# Patient Record
Sex: Male | Born: 1970 | Race: White | Hispanic: No | Marital: Married | Smoking: Former smoker
Health system: Southern US, Community
[De-identification: ages and names within clinical notes are randomized; demographics above are authoritative.]

## PROBLEM LIST (undated history)

## (undated) DIAGNOSIS — I5020 Unspecified systolic (congestive) heart failure: Secondary | ICD-10-CM

## (undated) DIAGNOSIS — I4892 Unspecified atrial flutter: Secondary | ICD-10-CM

## (undated) DIAGNOSIS — I10 Essential (primary) hypertension: Secondary | ICD-10-CM

## (undated) DIAGNOSIS — F1910 Other psychoactive substance abuse, uncomplicated: Secondary | ICD-10-CM

## (undated) DIAGNOSIS — R55 Syncope and collapse: Secondary | ICD-10-CM

## (undated) DIAGNOSIS — I2510 Atherosclerotic heart disease of native coronary artery without angina pectoris: Secondary | ICD-10-CM

## (undated) HISTORY — PX: NO PAST SURGERIES: SHX2092

---

## 2002-07-01 ENCOUNTER — Emergency Department (HOSPITAL_COMMUNITY): Admission: EM | Admit: 2002-07-01 | Discharge: 2002-07-01 | Payer: Self-pay | Admitting: Emergency Medicine

## 2002-09-25 ENCOUNTER — Emergency Department (HOSPITAL_COMMUNITY): Admission: EM | Admit: 2002-09-25 | Discharge: 2002-09-25 | Payer: Self-pay | Admitting: Emergency Medicine

## 2004-11-08 ENCOUNTER — Emergency Department (HOSPITAL_COMMUNITY): Admission: EM | Admit: 2004-11-08 | Discharge: 2004-11-08 | Payer: Self-pay | Admitting: Emergency Medicine

## 2005-06-11 ENCOUNTER — Emergency Department (HOSPITAL_COMMUNITY): Admission: EM | Admit: 2005-06-11 | Discharge: 2005-06-11 | Payer: Self-pay | Admitting: Emergency Medicine

## 2007-10-02 ENCOUNTER — Emergency Department: Payer: Self-pay | Admitting: Emergency Medicine

## 2009-09-26 ENCOUNTER — Emergency Department: Payer: Self-pay | Admitting: Emergency Medicine

## 2011-06-29 ENCOUNTER — Ambulatory Visit: Payer: Self-pay

## 2011-06-29 LAB — RAPID STREP-A WITH REFLX: Micro Text Report: NEGATIVE

## 2011-07-01 LAB — BETA STREP CULTURE(ARMC)

## 2013-03-08 ENCOUNTER — Ambulatory Visit: Payer: Self-pay | Admitting: Internal Medicine

## 2013-03-08 LAB — CBC WITH DIFFERENTIAL/PLATELET
Basophil #: 0.1 10*3/uL (ref 0.0–0.1)
HGB: 16.6 g/dL (ref 13.0–18.0)
Lymphocyte %: 15 %
MCH: 31.8 pg (ref 26.0–34.0)
MCV: 94 fL (ref 80–100)

## 2013-03-08 LAB — COMPREHENSIVE METABOLIC PANEL
Alkaline Phosphatase: 77 U/L (ref 50–136)
Anion Gap: 12 (ref 7–16)
BUN: 10 mg/dL (ref 7–18)
Bilirubin,Total: 0.4 mg/dL (ref 0.2–1.0)
Calcium, Total: 9.1 mg/dL (ref 8.5–10.1)
Chloride: 103 mmol/L (ref 98–107)
Creatinine: 0.94 mg/dL (ref 0.60–1.30)
Glucose: 71 mg/dL (ref 65–99)
SGOT(AST): 32 U/L (ref 15–37)
SGPT (ALT): 60 U/L (ref 12–78)

## 2013-08-26 ENCOUNTER — Emergency Department: Payer: Self-pay | Admitting: Internal Medicine

## 2013-10-23 ENCOUNTER — Ambulatory Visit: Payer: Self-pay | Admitting: Internal Medicine

## 2013-12-10 ENCOUNTER — Ambulatory Visit: Payer: Self-pay | Admitting: Gastroenterology

## 2013-12-15 ENCOUNTER — Ambulatory Visit: Payer: Self-pay | Admitting: Gastroenterology

## 2013-12-16 LAB — PATHOLOGY REPORT

## 2014-12-03 ENCOUNTER — Other Ambulatory Visit: Payer: Self-pay | Admitting: Gastroenterology

## 2014-12-03 DIAGNOSIS — R5382 Chronic fatigue, unspecified: Secondary | ICD-10-CM

## 2014-12-03 DIAGNOSIS — R1013 Epigastric pain: Secondary | ICD-10-CM

## 2014-12-09 ENCOUNTER — Ambulatory Visit: Admission: RE | Admit: 2014-12-09 | Payer: Commercial Managed Care - PPO | Source: Ambulatory Visit

## 2016-04-18 ENCOUNTER — Ambulatory Visit
Admission: EM | Admit: 2016-04-18 | Discharge: 2016-04-18 | Disposition: A | Discharge status: Home / Self Care | Payer: Commercial Managed Care - PPO | Attending: Family Medicine | Admitting: Family Medicine

## 2016-04-18 DIAGNOSIS — S39012A Strain of muscle, fascia and tendon of lower back, initial encounter: Secondary | ICD-10-CM

## 2016-04-18 HISTORY — DX: Essential (primary) hypertension: I10

## 2016-04-18 MED ORDER — HYDROCODONE-ACETAMINOPHEN 5-325 MG PO TABS: ORAL_TABLET | ORAL | 0 refills | Status: DC

## 2016-04-18 MED ORDER — PREDNISONE 20 MG PO TABS: 20.0000 mg | ORAL_TABLET | Freq: Every day | ORAL | 0 refills | Status: DC

## 2016-04-18 MED ORDER — CYCLOBENZAPRINE HCL 10 MG PO TABS: 10.0000 mg | ORAL_TABLET | Freq: Three times a day (TID) | ORAL | 0 refills | Status: DC | PRN

## 2016-04-18 NOTE — ED Provider Notes (Signed)
[facility]    CSN: [id number] Arrival date & time: 04/18/16  0910     History   Chief Complaint Chief Complaint  Patient presents with  . Back Pain    HPI Tyler [name] is a 45 y.o. male.   The history is provided by the patient.  Back Pain  Location:  Lumbar spine Quality:  Aching Radiates to:  Does not radiate Pain severity:  Moderate Pain is:  Same all the time Duration:  4 days Timing:  Constant Progression:  Unchanged Chronicity:  New Context: lifting heavy objects (helping son move) and twisting   Relieved by:  None tried Ineffective treatments:  None tried Associated symptoms: no abdominal pain, no abdominal swelling, no bladder incontinence, no bowel incontinence, no chest pain, no dysuria, no fever, no headaches, no leg pain, no numbness, no paresthesias, no pelvic pain, no perianal numbness, no tingling, no weakness and no weight loss     Past Medical History:  Diagnosis Date  . Hypertension     There are no active problems to display for this patient.   Past Surgical History:  Procedure Laterality Date  . NO PAST SURGERIES         Home Medications    Prior to Admission medications   Medication Sig Start Date End Date Taking? Authorizing Provider  amLODipine (NORVASC) 5 MG tablet Take 5 mg by mouth daily.   Yes Historical Provider, MD  cyclobenzaprine (FLEXERIL) 10 MG tablet Take 1 tablet (10 mg total) by mouth 3 (three) times daily as needed for muscle spasms. 04/18/16   Payton Mccallumrlando Debanhi Blaker, MD  HYDROcodone-acetaminophen (NORCO/VICODIN) 5-325 MG tablet 1-2 tabs po q 8 hours prn severe pain 04/18/16   Payton Mccallumrlando Abass Misener, MD  predniSONE (DELTASONE) 20 MG tablet Take 1 tablet (20 mg total) by mouth daily. 04/18/16   Payton Mccallumrlando Jonanthony Nahar, MD    Family History History reviewed. No pertinent family history.  Social History Social History  Substance Use Topics  . Smoking status: Current Every Day Smoker    Packs/day: 1.00  . Smokeless tobacco:  Never Used  . Alcohol use Yes     Comment: beer daily     Allergies   Patient has no known allergies.   Review of Systems Review of Systems  Constitutional: Negative for fever and weight loss.  Cardiovascular: Negative for chest pain.  Gastrointestinal: Negative for abdominal pain and bowel incontinence.  Genitourinary: Negative for bladder incontinence, dysuria and pelvic pain.  Musculoskeletal: Positive for back pain.  Neurological: Negative for tingling, weakness, numbness, headaches and paresthesias.     Physical Exam Triage Vital Signs ED Triage Vitals  Enc Vitals Group     BP 04/18/16 1016 (!) 157/99     Pulse Rate 04/18/16 1016 65     Resp 04/18/16 1016 17     Temp 04/18/16 1016 97.8 F (36.6 C)     Temp Source 04/18/16 1016 Oral     SpO2 04/18/16 1016 100 %     Weight 04/18/16 1014 165 lb (74.8 kg)     Height 04/18/16 1014 5\' 9"  (1.753 m)     Head Circumference --      Peak Flow --      Pain Score 04/18/16 1016 10     Pain Loc --      Pain Edu? --      Excl. in [place]? --    No data found.   Updated Vital Signs BP (!) 157/99 (BP Location: Left Arm)  Pulse 65   Temp 97.8 F (36.6 C) (Oral)   Resp 17   Ht 5\' 9"  (1.753 m)   Wt 165 lb (74.8 kg)   SpO2 100%   BMI 24.37 kg/m   Visual Acuity Right Eye Distance:   Left Eye Distance:   Bilateral Distance:    Right Eye Near:   Left Eye Near:    Bilateral Near:     Physical Exam  Constitutional: He is oriented to person, place, and time. He appears well-developed and well-nourished. No distress.  Neck: Normal range of motion. Neck supple. No tracheal deviation present.  Pulmonary/Chest: Effort normal. No stridor. No respiratory distress.  Musculoskeletal:       Cervical back: He exhibits normal range of motion, no tenderness, no bony tenderness, no swelling, no edema, no deformity, no laceration, no pain, no spasm and normal pulse.       Lumbar back: He exhibits tenderness (over the lumbar paraspinous  muscles, left greater than right) and spasm. He exhibits normal range of motion, no bony tenderness, no swelling, no edema, no deformity, no laceration, no pain and normal pulse.  Neurological: He is alert and oriented to person, place, and time. He has normal reflexes. He displays normal reflexes. He exhibits normal muscle tone. Coordination normal.  Skin: No rash noted. He is not diaphoretic.  Nursing note and vitals reviewed.    UC Treatments / Results  Labs (all labs ordered are listed, but only abnormal results are displayed) Labs Reviewed - No data to display  EKG  EKG Interpretation None       Radiology No results found.  Procedures Procedures (including critical care time)  Medications Ordered in UC Medications - No data to display   Initial Impression / Assessment and Plan / UC Course  I have reviewed the triage vital signs and the nursing notes.  Pertinent labs & imaging results that were available during my care of the patient were reviewed by me and considered in my medical decision making (see chart for details).  Clinical Course       Final Clinical Impressions(s) / UC Diagnoses   Final diagnoses:  Strain of lumbar region, initial encounter    New Prescriptions Discharge Medication List as of 04/18/2016 10:50 AM    START taking these medications   Details  cyclobenzaprine (FLEXERIL) 10 MG tablet Take 1 tablet (10 mg total) by mouth 3 (three) times daily as needed for muscle spasms., Starting Wed 04/18/2016, Normal    HYDROcodone-acetaminophen (NORCO/VICODIN) 5-325 MG tablet 1-2 tabs po q 8 hours prn severe pain, Print    predniSONE (DELTASONE) 20 MG tablet Take 1 tablet (20 mg total) by mouth daily., Starting Wed 04/18/2016, Normal        1. diagnosis reviewed with patient 2. rx as per orders above; reviewed possible side effects, interactions, risks and benefits  3. Recommend supportive treatment with ice/heat, gentle stretching 4.  Follow-up prn if symptoms worsen or don't improve   Payton Mccallum, MD 04/18/16 1300

## 2016-04-18 NOTE — ED Triage Notes (Signed)
Patient complains of back pain that started over the weekend after helping his son move. Patient states that he went to work the last few days and back worsened since he lifts heavy things. Patient reports that pain is in lower back and radiates to both legs slightly.

## 2019-05-07 ENCOUNTER — Inpatient Hospital Stay: Payer: Self-pay

## 2019-05-07 ENCOUNTER — Other Ambulatory Visit: Payer: Self-pay

## 2019-05-07 ENCOUNTER — Inpatient Hospital Stay
Admission: EM | Admit: 2019-05-07 | Discharge: 2019-05-11 | DRG: 917 | Disposition: A | Discharge status: Home / Self Care | Payer: Self-pay | Attending: Internal Medicine | Admitting: Internal Medicine

## 2019-05-07 DIAGNOSIS — Z8249 Family history of ischemic heart disease and other diseases of the circulatory system: Secondary | ICD-10-CM

## 2019-05-07 DIAGNOSIS — I110 Hypertensive heart disease with heart failure: Secondary | ICD-10-CM | POA: Diagnosis present

## 2019-05-07 DIAGNOSIS — Z20822 Contact with and (suspected) exposure to covid-19: Secondary | ICD-10-CM | POA: Diagnosis present

## 2019-05-07 DIAGNOSIS — T43621A Poisoning by amphetamines, accidental (unintentional), initial encounter: Principal | ICD-10-CM | POA: Diagnosis present

## 2019-05-07 DIAGNOSIS — F1910 Other psychoactive substance abuse, uncomplicated: Secondary | ICD-10-CM

## 2019-05-07 DIAGNOSIS — I429 Cardiomyopathy, unspecified: Secondary | ICD-10-CM

## 2019-05-07 DIAGNOSIS — S0232XA Fracture of orbital floor, left side, initial encounter for closed fracture: Secondary | ICD-10-CM | POA: Diagnosis present

## 2019-05-07 DIAGNOSIS — J45909 Unspecified asthma, uncomplicated: Secondary | ICD-10-CM | POA: Diagnosis present

## 2019-05-07 DIAGNOSIS — I4892 Unspecified atrial flutter: Secondary | ICD-10-CM | POA: Insufficient documentation

## 2019-05-07 DIAGNOSIS — F17210 Nicotine dependence, cigarettes, uncomplicated: Secondary | ICD-10-CM | POA: Diagnosis present

## 2019-05-07 DIAGNOSIS — I255 Ischemic cardiomyopathy: Secondary | ICD-10-CM | POA: Diagnosis present

## 2019-05-07 DIAGNOSIS — I483 Typical atrial flutter: Secondary | ICD-10-CM | POA: Diagnosis present

## 2019-05-07 DIAGNOSIS — F1510 Other stimulant abuse, uncomplicated: Secondary | ICD-10-CM | POA: Diagnosis present

## 2019-05-07 DIAGNOSIS — R55 Syncope and collapse: Secondary | ICD-10-CM | POA: Diagnosis present

## 2019-05-07 DIAGNOSIS — I10 Essential (primary) hypertension: Secondary | ICD-10-CM

## 2019-05-07 DIAGNOSIS — N179 Acute kidney failure, unspecified: Secondary | ICD-10-CM | POA: Diagnosis present

## 2019-05-07 DIAGNOSIS — I5021 Acute systolic (congestive) heart failure: Secondary | ICD-10-CM | POA: Diagnosis present

## 2019-05-07 DIAGNOSIS — Z7952 Long term (current) use of systemic steroids: Secondary | ICD-10-CM

## 2019-05-07 DIAGNOSIS — I5020 Unspecified systolic (congestive) heart failure: Secondary | ICD-10-CM

## 2019-05-07 DIAGNOSIS — Y9289 Other specified places as the place of occurrence of the external cause: Secondary | ICD-10-CM

## 2019-05-07 DIAGNOSIS — W1830XA Fall on same level, unspecified, initial encounter: Secondary | ICD-10-CM | POA: Diagnosis present

## 2019-05-07 DIAGNOSIS — F1210 Cannabis abuse, uncomplicated: Secondary | ICD-10-CM | POA: Diagnosis present

## 2019-05-07 DIAGNOSIS — Z23 Encounter for immunization: Secondary | ICD-10-CM

## 2019-05-07 DIAGNOSIS — R0602 Shortness of breath: Secondary | ICD-10-CM

## 2019-05-07 DIAGNOSIS — Z79899 Other long term (current) drug therapy: Secondary | ICD-10-CM

## 2019-05-07 LAB — CBC WITH DIFFERENTIAL/PLATELET
Abs Immature Granulocytes: 0.06 10*3/uL (ref 0.00–0.07)
Basophils Absolute: 0 10*3/uL (ref 0.0–0.1)
Basophils Relative: 0 %
Eosinophils Absolute: 0.1 10*3/uL (ref 0.0–0.5)
Eosinophils Relative: 1 %
HCT: 46.6 % (ref 39.0–52.0)
Hemoglobin: 15.2 g/dL (ref 13.0–17.0)
Immature Granulocytes: 0 %
Lymphocytes Relative: 11 %
Lymphs Abs: 1.5 10*3/uL (ref 0.7–4.0)
MCH: 30.4 pg (ref 26.0–34.0)
MCHC: 32.6 g/dL (ref 30.0–36.0)
MCV: 93.2 fL (ref 80.0–100.0)
Monocytes Absolute: 0.8 10*3/uL (ref 0.1–1.0)
Monocytes Relative: 6 %
Neutro Abs: 10.9 10*3/uL — ABNORMAL HIGH (ref 1.7–7.7)
Neutrophils Relative %: 82 %
Platelets: 242 10*3/uL (ref 150–400)
RBC: 5 MIL/uL (ref 4.22–5.81)
RDW: 12.9 % (ref 11.5–15.5)
WBC: 13.4 10*3/uL — ABNORMAL HIGH (ref 4.0–10.5)
nRBC: 0 % (ref 0.0–0.2)

## 2019-05-07 LAB — URINALYSIS, COMPLETE (UACMP) WITH MICROSCOPIC
Bilirubin Urine: NEGATIVE
Glucose, UA: NEGATIVE mg/dL
Hgb urine dipstick: NEGATIVE
Ketones, ur: NEGATIVE mg/dL
Leukocytes,Ua: NEGATIVE
Nitrite: NEGATIVE
Protein, ur: 30 mg/dL — AB
Specific Gravity, Urine: 1.012 (ref 1.005–1.030)
Squamous Epithelial / HPF: NONE SEEN (ref 0–5)
pH: 6 (ref 5.0–8.0)

## 2019-05-07 LAB — COMPREHENSIVE METABOLIC PANEL
ALT: 43 U/L (ref 0–44)
AST: 48 U/L — ABNORMAL HIGH (ref 15–41)
Albumin: 4 g/dL (ref 3.5–5.0)
Alkaline Phosphatase: 58 U/L (ref 38–126)
Anion gap: 9 (ref 5–15)
BUN: 15 mg/dL (ref 6–20)
CO2: 25 mmol/L (ref 22–32)
Calcium: 8.6 mg/dL — ABNORMAL LOW (ref 8.9–10.3)
Chloride: 107 mmol/L (ref 98–111)
Creatinine, Ser: 1.29 mg/dL — ABNORMAL HIGH (ref 0.61–1.24)
GFR calc Af Amer: >60 mL/min (ref >60)
GFR calc non Af Amer: >60 mL/min (ref >60)
Glucose, Bld: 116 mg/dL — ABNORMAL HIGH (ref 70–99)
Potassium: 3.7 mmol/L (ref 3.5–5.1)
Sodium: 141 mmol/L (ref 135–145)
Total Bilirubin: 0.7 mg/dL (ref 0.3–1.2)
Total Protein: 6.7 g/dL (ref 6.5–8.1)

## 2019-05-07 LAB — TSH: TSH: 3.25 u[IU]/mL (ref 0.350–4.500)

## 2019-05-07 LAB — CBC
HCT: 44.8 % (ref 39.0–52.0)
Hemoglobin: 15.4 g/dL (ref 13.0–17.0)
MCH: 30.5 pg (ref 26.0–34.0)
MCHC: 34.4 g/dL (ref 30.0–36.0)
MCV: 88.7 fL (ref 80.0–100.0)
Platelets: 268 10*3/uL (ref 150–400)
RBC: 5.05 MIL/uL (ref 4.22–5.81)
RDW: 12.9 % (ref 11.5–15.5)
WBC: 12.2 10*3/uL — ABNORMAL HIGH (ref 4.0–10.5)
nRBC: 0 % (ref 0.0–0.2)

## 2019-05-07 LAB — URINE DRUG SCREEN, QUALITATIVE (ARMC ONLY)
Amphetamines, Ur Screen: POSITIVE — AB
Barbiturates, Ur Screen: NOT DETECTED
Benzodiazepine, Ur Scrn: POSITIVE — AB
Cannabinoid 50 Ng, Ur ~~LOC~~: POSITIVE — AB
Cocaine Metabolite,Ur ~~LOC~~: NOT DETECTED
MDMA (Ecstasy)Ur Screen: NOT DETECTED
Methadone Scn, Ur: NOT DETECTED
Opiate, Ur Screen: NOT DETECTED
Phencyclidine (PCP) Ur S: NOT DETECTED
Tricyclic, Ur Screen: NOT DETECTED

## 2019-05-07 LAB — TROPONIN I (HIGH SENSITIVITY): Troponin I (High Sensitivity): 87 ng/L — ABNORMAL HIGH (ref <18)

## 2019-05-07 LAB — ETHANOL: Alcohol, Ethyl (B): <10 mg/dL (ref <10)

## 2019-05-07 LAB — PHOSPHORUS: Phosphorus: 3 mg/dL (ref 2.5–4.6)

## 2019-05-07 LAB — MAGNESIUM: Magnesium: 2 mg/dL (ref 1.7–2.4)

## 2019-05-07 MED ORDER — SODIUM CHLORIDE 0.9% FLUSH
3.0000 mL | Freq: Two times a day (BID) | INTRAVENOUS | Status: DC
  Administered 2019-05-08 – 2019-05-11 (×7): 3 mL via INTRAVENOUS

## 2019-05-07 MED ORDER — LORAZEPAM 2 MG/ML IJ SOLN
1.0000 mg | INTRAMUSCULAR | Status: DC | PRN
  Administered 2019-05-09 (×2): 2 mg via INTRAVENOUS
  Filled 2019-05-07 (×2): qty 1

## 2019-05-07 MED ORDER — ADENOSINE 6 MG/2ML IV SOLN: 6.0000 mg | Freq: Once | INTRAVENOUS | Status: AC

## 2019-05-07 MED ORDER — THIAMINE HCL 100 MG/ML IJ SOLN
100.0000 mg | Freq: Every day | INTRAMUSCULAR | Status: DC
  Filled 2019-05-07: qty 2

## 2019-05-07 MED ORDER — ALPRAZOLAM 0.25 MG PO TABS
0.2500 mg | ORAL_TABLET | Freq: Two times a day (BID) | ORAL | Status: DC | PRN
  Administered 2019-05-08 – 2019-05-11 (×3): 0.25 mg via ORAL
  Filled 2019-05-07 (×3): qty 1

## 2019-05-07 MED ORDER — FOLIC ACID 1 MG PO TABS
1.0000 mg | ORAL_TABLET | Freq: Every day | ORAL | Status: DC
  Administered 2019-05-08 – 2019-05-11 (×5): 1 mg via ORAL
  Filled 2019-05-07 (×5): qty 1

## 2019-05-07 MED ORDER — DILTIAZEM HCL 25 MG/5ML IV SOLN
15.0000 mg | Freq: Once | INTRAVENOUS | Status: AC
  Administered 2019-05-07: 15:00:00 15 mg via INTRAVENOUS
  Filled 2019-05-07: qty 5

## 2019-05-07 MED ORDER — LORAZEPAM 1 MG PO TABS: 1.0000 mg | ORAL_TABLET | ORAL | Status: DC | PRN

## 2019-05-07 MED ORDER — LORAZEPAM 2 MG/ML IJ SOLN
1.0000 mg | Freq: Once | INTRAMUSCULAR | Status: AC
  Administered 2019-05-07: 1 mg via INTRAVENOUS
  Filled 2019-05-07: qty 1

## 2019-05-07 MED ORDER — DILTIAZEM HCL-DEXTROSE 125-5 MG/125ML-% IV SOLN (PREMIX)
5.0000 mg/h | INTRAVENOUS | Status: DC
  Administered 2019-05-07: 5 mg/h via INTRAVENOUS
  Administered 2019-05-08 – 2019-05-09 (×2): 15 mg/h via INTRAVENOUS
  Filled 2019-05-07 (×6): qty 125

## 2019-05-07 MED ORDER — ONDANSETRON HCL 4 MG/2ML IJ SOLN: 4.0000 mg | Freq: Four times a day (QID) | INTRAMUSCULAR | Status: DC | PRN

## 2019-05-07 MED ORDER — HYDROCODONE-ACETAMINOPHEN 5-325 MG PO TABS
1.0000 | ORAL_TABLET | Freq: Four times a day (QID) | ORAL | Status: DC | PRN
  Administered 2019-05-08 – 2019-05-09 (×3): 1 via ORAL
  Filled 2019-05-07 (×3): qty 1

## 2019-05-07 MED ORDER — ADENOSINE 12 MG/4ML IV SOLN
INTRAVENOUS | Status: AC
  Administered 2019-05-07: 16:00:00 12 mg via INTRAVENOUS
  Filled 2019-05-07: qty 4

## 2019-05-07 MED ORDER — METOPROLOL TARTRATE 25 MG PO TABS
12.5000 mg | ORAL_TABLET | Freq: Four times a day (QID) | ORAL | Status: DC
  Administered 2019-05-08 (×2): 12.5 mg via ORAL
  Filled 2019-05-07 (×2): qty 1

## 2019-05-07 MED ORDER — SODIUM CHLORIDE 0.9 % IV BOLUS
1000.0000 mL | Freq: Once | INTRAVENOUS | Status: AC
  Administered 2019-05-07: 1000 mL via INTRAVENOUS

## 2019-05-07 MED ORDER — ADULT MULTIVITAMIN W/MINERALS CH
1.0000 | ORAL_TABLET | Freq: Every day | ORAL | Status: DC
  Administered 2019-05-08 – 2019-05-11 (×5): 1 via ORAL
  Filled 2019-05-07 (×5): qty 1

## 2019-05-07 MED ORDER — LABETALOL HCL 5 MG/ML IV SOLN: 5.0000 mg | Freq: Four times a day (QID) | INTRAVENOUS | Status: DC | PRN

## 2019-05-07 MED ORDER — METOPROLOL TARTRATE 5 MG/5ML IV SOLN
15.0000 mg | Freq: Once | INTRAVENOUS | Status: AC
  Administered 2019-05-07: 15 mg via INTRAVENOUS
  Filled 2019-05-07: qty 15

## 2019-05-07 MED ORDER — ACETAMINOPHEN 325 MG PO TABS: 650.0000 mg | ORAL_TABLET | ORAL | Status: DC | PRN

## 2019-05-07 MED ORDER — ENOXAPARIN SODIUM 40 MG/0.4ML ~~LOC~~ SOLN
40.0000 mg | SUBCUTANEOUS | Status: DC
  Administered 2019-05-08: 40 mg via SUBCUTANEOUS
  Filled 2019-05-07: qty 0.4

## 2019-05-07 MED ORDER — LORAZEPAM 2 MG/ML IJ SOLN
0.5000 mg | Freq: Once | INTRAMUSCULAR | Status: AC
  Administered 2019-05-07: 12:00:00 0.5 mg via INTRAVENOUS
  Filled 2019-05-07: qty 1

## 2019-05-07 MED ORDER — METOPROLOL TARTRATE 5 MG/5ML IV SOLN: 10.0000 mg | Freq: Once | INTRAVENOUS | Status: DC

## 2019-05-07 MED ORDER — POTASSIUM CHLORIDE CRYS ER 20 MEQ PO TBCR
20.0000 meq | EXTENDED_RELEASE_TABLET | Freq: Two times a day (BID) | ORAL | Status: DC
  Administered 2019-05-08 – 2019-05-11 (×8): 20 meq via ORAL
  Filled 2019-05-07 (×8): qty 1

## 2019-05-07 MED ORDER — ADENOSINE 12 MG/4ML IV SOLN: 12.0000 mg | Freq: Once | INTRAVENOUS | Status: AC

## 2019-05-07 MED ORDER — THIAMINE HCL 100 MG PO TABS
100.0000 mg | ORAL_TABLET | Freq: Every day | ORAL | Status: DC
  Administered 2019-05-08 – 2019-05-11 (×5): 100 mg via ORAL
  Filled 2019-05-07 (×5): qty 1

## 2019-05-07 MED ORDER — ADENOSINE 6 MG/2ML IV SOLN
INTRAVENOUS | Status: AC
  Administered 2019-05-07: 6 mg via INTRAVENOUS
  Filled 2019-05-07: qty 2

## 2019-05-07 MED ORDER — SODIUM CHLORIDE 0.9 % IV SOLN: INTRAVENOUS | Status: DC

## 2019-05-07 NOTE — ED Notes (Signed)
Cardiology at bedside.

## 2019-05-07 NOTE — ED Notes (Addendum)
Pt mother and wife given update with verbal permission from pt

## 2019-05-07 NOTE — ED Notes (Signed)
Pt gave verbal permission to give wife an update- wife did not answer

## 2019-05-07 NOTE — ED Notes (Signed)
Cardiology and Dr Izetta Dakin aware of pt maxed out on 33ml/hr on the cardizem

## 2019-05-07 NOTE — Progress Notes (Signed)
[facility]  Date: 05/07/19 Time: 7:51 PM  I was contacted by the patient's RN regarding continued tachycardia with ventricular rates near 150 bpm.  Underlying rhythm is atrial flutter, which will likely be difficult to rate control, particularly given recent methamphetamine use.  Patient is currently on diltiazem gtt at 15 mg/hr.  CT head without acute intracranial abnormality but with left nasal bone fractures and possible hemorrhage in the left maxillary sinus.  I recommend continuation of diltiazem infusion, per protocol.  I will add low-dose oral metoprolol, though I would not be surprised if this does not significantly alter his heart rate.  He will likely need TEE-guided cardioversion if he does not spontaneously convert to sinus rhythm.  Apixaban was previously recommended by Dr. Garen Lah if head CT did not show any contraindications.  Given likely need for DCCV, therapeutic anticoagulation before and for at least 1 month after cardioversion is favored.  In light of this, I recommend that the primary service obtain ENT consultation to weigh in on safety of anticoagulation in the setting of facial fractures and possible left maxillary sinus hematoma.  Nelva Bush, MD [facility]

## 2019-05-07 NOTE — ED Provider Notes (Signed)
[facility] Emergency Department Provider Note   ____________________________________________    I have reviewed the triage vital signs and the nursing notes.   HISTORY  Chief Complaint Loss of Consciousness and Tachycardia     HPI Tyler [name] is a 48 y.o. male with a history of hypertension presents after syncopal episode, dizziness and chest tightness.  Patient reports he fell 24 hours ago because he was dizzy and lightheaded.  He does admit to using methamphetamines last night at 3 AM.  Since then he has been having tightness in his chest, palpitations and dizziness.  Some nausea no vomiting.  No fevers or chills.  No cough shortness of breath  Past Medical History:  Diagnosis Date  . Hypertension     Patient Active Problem List   Diagnosis Date Noted  . Syncope and collapse 05/07/2019  . Atrial flutter ([place])   . Essential hypertension     Past Surgical History:  Procedure Laterality Date  . NO PAST SURGERIES      Prior to Admission medications   Medication Sig Start Date End Date Taking? Authorizing Provider  amLODipine (NORVASC) 5 MG tablet Take 5 mg by mouth daily.    [provider]  cyclobenzaprine (FLEXERIL) 10 MG tablet Take 1 tablet (10 mg total) by mouth 3 (three) times daily as needed for muscle spasms. 04/18/16   Norval Gable, MD  HYDROcodone-acetaminophen (NORCO/VICODIN) 5-325 MG tablet 1-2 tabs po q 8 hours prn severe pain 04/18/16   Norval Gable, MD  predniSONE (DELTASONE) 20 MG tablet Take 1 tablet (20 mg total) by mouth daily. 04/18/16   Norval Gable, MD     Allergies Patient has no known allergies.  History reviewed. No pertinent family history.  Social History Social History   Tobacco Use  . Smoking status: Current Every Day Smoker    Packs/day: 1.00  . Smokeless tobacco: Never Used  Substance Use Topics  . Alcohol use: Yes    Comment: beer daily  . Drug use: Yes    Types: Methamphetamines      Review of Systems  Constitutional: No fever/chills Eyes: No visual changes.  ENT: No sore throat. Cardiovascular: As above Respiratory: Denies shortness of breath. Gastrointestinal: No abdominal pain.  Genitourinary: Negative for dysuria. Musculoskeletal: Negative for back pain. Skin: Negative for rash. Neurological: Negative for headache   ____________________________________________   PHYSICAL EXAM:  VITAL SIGNS: ED Triage Vitals  Enc Vitals Group     BP 05/07/19 1214 123/90     Pulse Rate 05/07/19 1214 (!) 154     Resp 05/07/19 1214 (!) 22     Temp 05/07/19 1214 98 F (36.7 C)     Temp Source 05/07/19 1214 Oral     SpO2 05/07/19 1214 99 %     Weight 05/07/19 1215 77.1 kg (170 lb)     Height 05/07/19 1215 1.727 m (5\' 8" )     Head Circumference --      Peak Flow --      Pain Score 05/07/19 1215 7     Pain Loc --      Pain Edu? --      Excl. in [place]? --     Constitutional: Alert and oriented.  Eyes: Bruise over the left orbit Nose: No congestion/rhinnorhea. Mouth/Throat: Mucous membranes are moist.    Cardiovascular: Tachycardia grossly normal heart sounds.  Good peripheral circulation. Respiratory: Normal respiratory effort.  No retractions.  Gastrointestinal: Soft and nontender. No distention.  Musculoskeletal: Warm and well perfused Neurologic:  Normal speech and language. No gross focal neurologic deficits are appreciated.  Skin:  Skin is warm, dry and intact. No rash noted. Psychiatric: Mood and affect are normal. Speech and behavior are normal.  ____________________________________________   LABS (all labs ordered are listed, but only abnormal results are displayed)  Labs Reviewed  CBC WITH DIFFERENTIAL/PLATELET - Abnormal; Notable for the following components:      Result Value   WBC 13.4 (*)    Neutro Abs 10.9 (*)    All other components within normal limits  COMPREHENSIVE METABOLIC PANEL - Abnormal; Notable for the following components:    Glucose, Bld 116 (*)    Creatinine, Ser 1.29 (*)    Calcium 8.6 (*)    AST 48 (*)    All other components within normal limits  TROPONIN I (HIGH SENSITIVITY) - Abnormal; Notable for the following components:   Troponin I (High Sensitivity) 87 (*)    All other components within normal limits  SARS CORONAVIRUS 2 (TAT 6-24 HRS)  TSH  ETHANOL  URINE DRUG SCREEN, QUALITATIVE ([facility] ONLY)  MAGNESIUM  PHOSPHORUS  CBC   ____________________________________________  EKG  ED ECG REPORT I, Jene Every, the attending physician, personally viewed and interpreted this ECG.  Date: 05/07/2019  Rhythm: Atrial tachycardia QRS Axis: normal Intervals: Abnormal ST/T Wave abnormalities: Nonspecific changes Narrative Interpretation: no evidence of acute ischemia  ____________________________________________  RADIOLOGY  None ____________________________________________   PROCEDURES  Procedure(s) performed: No  Procedures   Critical Care performed: yes CRITICAL CARE Performed by: Jene Every   Total critical care time: 30 minutes  Critical care time was exclusive of separately billable procedures and treating other patients.  Critical care was necessary to treat or prevent imminent or life-threatening deterioration.  Critical care was time spent personally by me on the following activities: development of treatment plan with patient and/or surrogate as well as nursing, discussions with consultants, evaluation of patient's response to treatment, examination of patient, obtaining history from patient or surrogate, ordering and performing treatments and interventions, ordering and review of laboratory studies, ordering and review of radiographic studies, pulse oximetry and re-evaluation of patient's condition.  ____________________________________________   INITIAL IMPRESSION / ASSESSMENT AND PLAN / ED COURSE  Pertinent labs & imaging results that were available during my  care of the patient were reviewed by me and considered in my medical decision making (see chart for details).  Patient presents with tachycardia possibly related to methamphetamine abuse, given IV Ativan with little improvement, additional Ativan given with no further improvement.  15 mg of Cardizem given with no effect.  Discussed with cardiology who recommended trying 15 mg of IV Lopressor which was also unsuccessful.  Patient seen in person by cardiology who did give adenosine which demonstrated likely flutter waves.  Patient's troponin is elevated 87 likely rate related.  Cardiology recommends Cardizem drip and admission to the hospitalist service    ____________________________________________   FINAL CLINICAL IMPRESSION(S) / ED DIAGNOSES  Final diagnoses:  Atrial flutter, unspecified type (HCC)  Substance abuse (HCC)  Syncope and collapse        Note:  This document was prepared using Dragon voice recognition software and may include unintentional dictation errors.   Jene Every, MD 05/07/19 [phone]

## 2019-05-07 NOTE — ED Triage Notes (Signed)
Pt arrives to ED via [facility] from home. Fell yesterday. L eye bruising and swelling. Today was at [employer] and had syncopal episode with fall. C/o central CP "burning" and dizziness. Wife told EMS this has happened before. Meth use at 3am today. EMS states a flutter on monitor, no hx. HR 150. Received 324 ASA, received 595ml NS. CBG 238.
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
## 2019-05-07 NOTE — H&P (Addendum)
History and Physical  EDOARDO ERSTAD LGX:[id number] DOB: 20-Nov-1970 DOA: 05/07/2019  Referring physician: ED physician PCP: Patient, No Pcp Per  Outpatient Specialists:  1.    Chief Complaint: Passout  HPI: CURT DUPLER is a 48 y.o. male with significant past history of drug abuse, smoking for 30+ years comes to the ED today with recurrent fall experiencing recently.  Patient stated over the past couple of weeks he would feel short of breath suddenly and palpitation.  And lately he said he would pass out once or twice when he felt his breathing was to stop along with palpitation.  His symptoms would last for a couple of minutes.  This letter is passed out happen today while he was getting a car wash today.  He said he found himself on the web floor.  Patient was brought to the ED via EMS.  Patient used methamphetamine 3 AM this morning.  Patient currently denies any chest pain.  Also denies any shortness of breath or focal neurological deficit or neurological sequela. ED course in the emergency patient's heart rate was found to be in 150s with EKG showing possible a flutter/atrial fibrillation.  Patient received Cardizem and Lopressor IV.  Cardiology was called who also provided carotid massage.  And then received adenosine 6 mg and 12 mg.  Heart rate remained above 100s.  Patient then was a started on Cardizem drip. Hospitalist service has been called for further management.   Review of Systems: All systems reviewed and apart from history of presenting illness, are negative.  Past Medical History:  Diagnosis Date  . Hypertension    Past Surgical History:  Procedure Laterality Date  . NO PAST SURGERIES     Social History:  reports that he has been smoking. He has been smoking about 1.00 pack per day. He has never used smokeless tobacco. He reports current alcohol use. He reports current drug use. Drug: Methamphetamines.   No Known Allergies  History reviewed. No pertinent family  history.    Prior to Admission medications   Medication Sig Start Date End Date Taking? Authorizing Provider  amLODipine (NORVASC) 5 MG tablet Take 5 mg by mouth daily.    [provider]  cyclobenzaprine (FLEXERIL) 10 MG tablet Take 1 tablet (10 mg total) by mouth 3 (three) times daily as needed for muscle spasms. 04/18/16   Payton Mccallum, MD  HYDROcodone-acetaminophen (NORCO/VICODIN) 5-325 MG tablet 1-2 tabs po q 8 hours prn severe pain 04/18/16   Payton Mccallum, MD  predniSONE (DELTASONE) 20 MG tablet Take 1 tablet (20 mg total) by mouth daily. 04/18/16   Payton Mccallum, MD   Physical Exam: Vitals:   05/07/19 1214 05/07/19 1215 05/07/19 1230  BP: 123/90  (!) 120/92  Pulse: (!) 154  (!) 153  Resp: (!) 22  19  Temp: 98 F (36.7 C)    TempSrc: Oral    SpO2: 99%  97%  Weight:  77.1 kg   Height:  5\' 8"  (1.727 m)      General exam: Moderately built and nourished patient, appears tired.  Just came back to room from CT of the head   Head, eyes and ENT: Nontraumatic and normocephalic. Pupils equally reacting to light and accommodation.  Left ocular subcutaneous hemorrhage/bleeding  Neck: Supple. No JVD, carotid bruit or thyromegaly.  Lymphatics: No lymphadenopathy.  Respiratory system: Clear to auscultation. No increased work of breathing.  Cardiovascular system: Tachycardic, S1 and S2 heard, No JVD, murmurs, gallops, clicks or  pedal edema.  Gastrointestinal system: Abdomen is nondistended, soft and nontender. Normal bowel sounds heard. No organomegaly or masses appreciated.  Central nervous system: Alert and oriented. No focal neurological deficits.  Extremities: Symmetric 5 x 5 power. Peripheral pulses symmetrically felt.   Skin: No rashes or acute findings.  Darkish around the left orbital area from possible trauma after fall  Musculoskeletal system: Negative exam.  Psychiatry: Pleasant and cooperative.   Labs on Admission:  Basic Metabolic Panel: Recent Labs   Lab 05/07/19 1219  NA 141  K 3.7  CL 107  CO2 25  GLUCOSE 116*  BUN 15  CREATININE 1.29*  CALCIUM 8.6*   Liver Function Tests: Recent Labs  Lab 05/07/19 1219  AST 48*  ALT 43  ALKPHOS 58  BILITOT 0.7  PROT 6.7  ALBUMIN 4.0   No results for input(s): LIPASE, AMYLASE in the last 168 hours. No results for input(s): AMMONIA in the last 168 hours. CBC: Recent Labs  Lab 05/07/19 1219  WBC 13.4*  NEUTROABS 10.9*  HGB 15.2  HCT 46.6  MCV 93.2  PLT 242   Cardiac Enzymes: No results for input(s): CKTOTAL, CKMB, CKMBINDEX, TROPONINI in the last 168 hours.  BNP (last 3 results) No results for input(s): PROBNP in the last 8760 hours. CBG: No results for input(s): GLUCAP in the last 168 hours.  Radiological Exams on Admission: CT Head Wo Contrast  Result Date: 05/07/2019 CLINICAL DATA:  Fall, left eye bruising and swelling EXAM: CT HEAD WITHOUT CONTRAST TECHNIQUE: Contiguous axial images were obtained from the base of the skull through the vertex without intravenous contrast. COMPARISON:  CT facial bones, 03/08/2013 FINDINGS: Brain: No evidence of acute infarction, hemorrhage, hydrocephalus, extra-axial collection or mass lesion/mass effect. Vascular: No hyperdense vessel or unexpected calcification. Skull: Possible minimally displaced acute on chronic fractures of the left nasal bones (series 2, image 7). Negative for fracture or focal lesion. Sinuses/Orbits: There is a small, hyperdense air-fluid level of the left maxillary sinus. There is irregularity of the left orbital floor, not optimally evaluated on this non tailored examination. There is a nonacute left orbital floor fracture better assessed on prior dedicated CT examination of the facial bones. Nonacute fracture of the right lamina papyracea. Other: Soft tissue contusion about the left forehead, nose, left orbit, and left cheek. IMPRESSION: 1. No acute intracranial pathology 2. Possible minimally displaced acute on  chronic fractures of the left nasal bones. 3. There is a small, hyperdense air-fluid level of the left maxillary sinus suspicious for hemorrhage. There is irregularity of the left orbital floor, not optimally evaluated on this non tailored examination. There is a nonacute left orbital floor fracture better assessed on prior dedicated CT examination of the facial bones. Recommend dedicated CT examination of the facial bones to better assess for acutely superimposed recurrent facial bone or orbital fracture. 4. Soft tissue contusion about the left forehead, nose, left orbit, and left cheek. Electronically Signed   By: Lauralyn Primes M.D.   On: 05/07/2019 16:29    EKG: Independently reviewed.  Atrial flutter  Assessment/Plan: Patient is to be admitted to the stepdown unit. Active Problems:   Syncope and collapse  1) Atrial flutter: -Patient is currently on Cardizem drip per cardiology: Continue -Patient's CHA2DS2-VASc score of 1 - May start patient on Eliquis 5 mg twice daily per cardiology recommendation.  However CT head shows possible left orbital floor fracture and also small hyperdense air-fluid level of the left maxillary sinus suspicious for hemorrhage.  CT  head is negative for any acute findings including hemorrhage -We will hold onto Eliquis for now -Appreciate cardiology recommendation -We will obtain 2D cardiac echocardiogram -May need to go for TEE cardioversion if patient remains in a flutter despite Cardizem -Further recommendation to follow from cardiology.  Appreciate cardiology recommendation -N.p.o. from midnight  2) syncope: Appears to have recurrent syncope over the past couple of days to week with the latest one being today -Unknown etiology: Drug abuse/intoxication versus other etiologies versus stroke -We will obtain CT head without contrast, 2D echocardiogram -Monitor on telemetry -Troponin is negative and EKG does not show any ST changes suspected of MI -May give some IV  hydration -Check orthostatic vitals when is stable to leave the bed -Venous pulse oximetry -CIWA protocol -Check urine drug screen and blood alcohol level  3) drug abuse: -Patient admits using methamphetamine on a regular basis.  He also admits using marijuana occasionally.  But but says drinks alcohol very seldomly. -We will place patient on possible intoxication/withdrawal with caution with benzodiazepine -Cardiac monitoring and continuous pulse ox -As needed antihypertensives -Counseled on cessation -Monitor closely  4) AKI: Creatinine found to be 1.29 today even though it was normal from 2014  -Unsure the duration of his AKI versus CKD/AKI 1 CKD  -May provide hydration -Monitor renal function -Avoid potential nephrotoxic's and contrast materials  5) smoking: Admits to smoking 30+ years of regular cigarettes -But he says he no longer smokes for the last couple of days -Counseled on cessation -Offered but denied nicotine patch for the time being   DVT Prophylaxis: Lovenox Code Status: Full code Family Communication: None at bedside Disposition Plan: Home when stable  Time spent: 50 minutes  Thornell Mule, MD  [facility] Pager [phone]  If 7PM-7AM, please contact night-coverage [email or web address] Password [facility] 05/07/2019, 4:34 PM

## 2019-05-08 ENCOUNTER — Inpatient Hospital Stay (HOSPITAL_COMMUNITY)
Admit: 2019-05-08 | Discharge: 2019-05-08 | Disposition: A | Discharge status: Home / Self Care | Payer: Self-pay | Attending: Cardiology | Admitting: Cardiology

## 2019-05-08 ENCOUNTER — Inpatient Hospital Stay: Payer: Self-pay

## 2019-05-08 DIAGNOSIS — F1910 Other psychoactive substance abuse, uncomplicated: Secondary | ICD-10-CM

## 2019-05-08 DIAGNOSIS — I5020 Unspecified systolic (congestive) heart failure: Secondary | ICD-10-CM

## 2019-05-08 DIAGNOSIS — I429 Cardiomyopathy, unspecified: Secondary | ICD-10-CM

## 2019-05-08 DIAGNOSIS — I4892 Unspecified atrial flutter: Secondary | ICD-10-CM

## 2019-05-08 LAB — BASIC METABOLIC PANEL
Anion gap: 10 (ref 5–15)
BUN: 12 mg/dL (ref 6–20)
CO2: 25 mmol/L (ref 22–32)
Calcium: 8.9 mg/dL (ref 8.9–10.3)
Chloride: 105 mmol/L (ref 98–111)
Creatinine, Ser: 0.91 mg/dL (ref 0.61–1.24)
GFR calc Af Amer: >60 mL/min (ref >60)
GFR calc non Af Amer: >60 mL/min (ref >60)
Glucose, Bld: 108 mg/dL — ABNORMAL HIGH (ref 70–99)
Potassium: 4.3 mmol/L (ref 3.5–5.1)
Sodium: 140 mmol/L (ref 135–145)

## 2019-05-08 LAB — ECHOCARDIOGRAM COMPLETE
Height: 68 in
Weight: 2720 oz

## 2019-05-08 LAB — HEPARIN LEVEL (UNFRACTIONATED): Heparin Unfractionated: 0.14 IU/mL — ABNORMAL LOW (ref 0.30–0.70)

## 2019-05-08 LAB — LIPID PANEL
Cholesterol: 142 mg/dL (ref 0–200)
HDL: 61 mg/dL (ref >40)
LDL Cholesterol: 61 mg/dL (ref 0–99)
Total CHOL/HDL Ratio: 2.3 RATIO
Triglycerides: 102 mg/dL (ref <150)
VLDL: 20 mg/dL (ref 0–40)

## 2019-05-08 LAB — CBC
HCT: 43.6 % (ref 39.0–52.0)
Hemoglobin: 15 g/dL (ref 13.0–17.0)
MCH: 30.7 pg (ref 26.0–34.0)
MCHC: 34.4 g/dL (ref 30.0–36.0)
MCV: 89.3 fL (ref 80.0–100.0)
Platelets: 265 10*3/uL (ref 150–400)
RBC: 4.88 MIL/uL (ref 4.22–5.81)
RDW: 13.1 % (ref 11.5–15.5)
WBC: 10.6 10*3/uL — ABNORMAL HIGH (ref 4.0–10.5)
nRBC: 0 % (ref 0.0–0.2)

## 2019-05-08 LAB — PROTIME-INR
INR: 1 (ref 0.8–1.2)
Prothrombin Time: 12.6 seconds (ref 11.4–15.2)

## 2019-05-08 LAB — GLUCOSE, CAPILLARY
Glucose-Capillary: 77 mg/dL (ref 70–99)
Glucose-Capillary: 110 mg/dL — ABNORMAL HIGH (ref 70–99)
Glucose-Capillary: 92 mg/dL (ref 70–99)

## 2019-05-08 LAB — SARS CORONAVIRUS 2 (TAT 6-24 HRS): SARS Coronavirus 2: NEGATIVE

## 2019-05-08 LAB — APTT: aPTT: 28 seconds (ref 24–36)

## 2019-05-08 LAB — MRSA PCR SCREENING: MRSA by PCR: NEGATIVE

## 2019-05-08 MED ORDER — METOPROLOL TARTRATE 25 MG PO TABS
25.0000 mg | ORAL_TABLET | Freq: Four times a day (QID) | ORAL | Status: DC
  Administered 2019-05-08 – 2019-05-09 (×5): 25 mg via ORAL
  Filled 2019-05-08 (×6): qty 1

## 2019-05-08 MED ORDER — FUROSEMIDE 10 MG/ML IJ SOLN
40.0000 mg | Freq: Once | INTRAMUSCULAR | Status: AC
  Administered 2019-05-08: 19:00:00 40 mg via INTRAVENOUS

## 2019-05-08 MED ORDER — HEPARIN (PORCINE) 25000 UT/250ML-% IV SOLN
1600.0000 [IU]/h | INTRAVENOUS | Status: DC
  Administered 2019-05-08: 16:00:00 1100 [IU]/h via INTRAVENOUS
  Administered 2019-05-09: 14:00:00 1600 [IU]/h via INTRAVENOUS
  Filled 2019-05-08 (×3): qty 250

## 2019-05-08 MED ORDER — MORPHINE SULFATE (PF) 2 MG/ML IV SOLN
1.0000 mg | Freq: Once | INTRAVENOUS | Status: AC
  Administered 2019-05-08: 18:00:00 1 mg via INTRAVENOUS
  Filled 2019-05-08: qty 1

## 2019-05-08 MED ORDER — PERFLUTREN LIPID MICROSPHERE
1.0000 mL | INTRAVENOUS | Status: AC | PRN
  Administered 2019-05-08: 09:00:00 3 mL via INTRAVENOUS
  Filled 2019-05-08: qty 10

## 2019-05-08 MED ORDER — CHLORHEXIDINE GLUCONATE CLOTH 2 % EX PADS
6.0000 | MEDICATED_PAD | Freq: Every day | CUTANEOUS | Status: DC
  Administered 2019-05-08 – 2019-05-11 (×4): 6 via TOPICAL

## 2019-05-08 MED ORDER — IPRATROPIUM-ALBUTEROL 0.5-2.5 (3) MG/3ML IN SOLN: 3.0000 mL | RESPIRATORY_TRACT | Status: DC | PRN

## 2019-05-08 MED ORDER — IPRATROPIUM-ALBUTEROL 0.5-2.5 (3) MG/3ML IN SOLN
3.0000 mL | Freq: Once | RESPIRATORY_TRACT | Status: AC
  Administered 2019-05-08: 3 mL via RESPIRATORY_TRACT
  Filled 2019-05-08: qty 3

## 2019-05-08 MED ORDER — FUROSEMIDE 10 MG/ML IJ SOLN
INTRAMUSCULAR | Status: AC
  Filled 2019-05-08: qty 4

## 2019-05-08 MED ORDER — INFLUENZA VAC SPLIT QUAD 0.5 ML IM SUSY: 0.5000 mL | PREFILLED_SYRINGE | INTRAMUSCULAR | Status: DC

## 2019-05-08 MED ORDER — METOPROLOL TARTRATE 25 MG PO TABS: 25.0000 mg | ORAL_TABLET | Freq: Three times a day (TID) | ORAL | Status: DC

## 2019-05-08 MED ORDER — DIGOXIN 250 MCG PO TABS
0.2500 mg | ORAL_TABLET | Freq: Four times a day (QID) | ORAL | Status: AC
  Administered 2019-05-08 – 2019-05-09 (×3): 0.25 mg via ORAL
  Filled 2019-05-08 (×3): qty 1

## 2019-05-08 NOTE — Progress Notes (Signed)
ANTICOAGULATION CONSULT NOTE - Initial Consult  Pharmacy Consult for Heparin  Indication: atrial fibrillation  No Known Allergies  Patient Measurements: Height: 5\' 8"  (172.7 cm) Weight: 170 lb (77.1 kg) IBW/kg (Calculated) : 68.4 Heparin Dosing Weight: 77.1 kg  Vital Signs: Temp: 98.2 F (36.8 C) (01/01 1208) Temp Source: Oral (01/01 1208) BP: 121/92 (01/01 1300) Pulse Rate: 74 (01/01 1353)  Labs: Recent Labs    05/07/19 1219 05/07/19 1730 05/08/19 0538  HGB 15.2 15.4 15.0  HCT 46.6 44.8 43.6  PLT 242 268 265  LABPROT  --   --  12.6  INR  --   --  1.0  CREATININE 1.29*  --  0.91  TROPONINIHS 87*  --   --     Estimated Creatinine Clearance: 98.1 mL/min (by C-G formula based on SCr of 0.91 mg/dL).   Medical History: Past Medical History:  Diagnosis Date  . Hypertension      Assessment: 49 yo male to start heparin drip for atrial flutter with RVR. No PTA meds. Per heparin consult, NO bolus.   Goal of Therapy:  Heparin level 0.3-0.7 units/ml Monitor platelets by anticoagulation protocol: Yes   Plan:  APTT for baseline level (cannot add on per lab) No heparin bolus per consult Will start heparin drip at 1100 units/hr - discussed plan with RN HL 6h after start of heparin drip, CBC in AM  Pharmacy will continue to follow.   49 05/08/2019,2:54 PM

## 2019-05-08 NOTE — Progress Notes (Signed)
Tyler [name] at [place] NAME: Tyler [name]    MR#:  [id number]  DATE OF BIRTH:  1970/11/04  SUBJECTIVE:  patient came in after he had a syncopal episode and collapse. He has black eye. Found to be in atrial flutter currently on IV Cardizem drip. Heart rate remains in the 150s. Denies any chest pain. I feel awful REVIEW OF SYSTEMS:   Review of Systems  Constitutional: Negative for chills, fever and weight loss.  HENT: Negative for ear discharge, ear pain and nosebleeds.   Eyes: Negative for blurred vision, pain and discharge.  Respiratory: Negative for sputum production, shortness of breath, wheezing and stridor.   Cardiovascular: Positive for palpitations. Negative for chest pain, orthopnea and PND.  Gastrointestinal: Negative for abdominal pain, diarrhea, nausea and vomiting.  Genitourinary: Negative for frequency and urgency.  Musculoskeletal: Negative for back pain and joint pain.  Neurological: Positive for weakness. Negative for sensory change, speech change and focal weakness.  Psychiatric/Behavioral: Negative for depression and hallucinations. The patient is not nervous/anxious.    Tolerating Diet:yes Tolerating PT:   DRUG ALLERGIES:  No Known Allergies  VITALS:  Blood pressure (!) 121/92, pulse 74, temperature 98.2 F (36.8 C), temperature source Oral, resp. rate 19, height 5\' 8"  (1.727 m), weight 77.1 kg, SpO2 92 %.  PHYSICAL EXAMINATION:   Physical Exam  GENERAL:  49 y.o.-year-old patient lying in the bed with no acute distress.  EYES: Pupils equal, round, reactive to light and accommodation. Left black eye with swelling HEENT: Head atraumatic, normocephalic. Oropharynx and nasopharynx clear.  NECK:  Supple, no jugular venous distention. No thyroid enlargement, no tenderness.  LUNGS: Normal breath sounds bilaterally, no wheezing, rales, rhonchi. No use of accessory muscles of respiration.  CARDIOVASCULAR: S1, S2 normal. No  murmurs, rubs, or gallops. Tachycardia ABDOMEN: Soft, nontender, nondistended. Bowel sounds present. No organomegaly or mass.  EXTREMITIES: No cyanosis, clubbing or edema b/l.    NEUROLOGIC: Cranial nerves II through XII are intact. No focal Motor or sensory deficits b/l.   PSYCHIATRIC:  patient is alert and oriented x 3.  SKIN: No obvious rash, lesion, or ulcer.   LABORATORY PANEL:  CBC Recent Labs  Lab 05/08/19 0538  WBC 10.6*  HGB 15.0  HCT 43.6  PLT 265    Chemistries  Recent Labs  Lab 05/07/19 1219 05/07/19 1220 05/08/19 0538  NA 141  --  140  K 3.7  --  4.3  CL 107  --  105  CO2 25  --  25  GLUCOSE 116*  --  108*  BUN 15  --  12  CREATININE 1.29*  --  0.91  CALCIUM 8.6*  --  8.9  MG  --  2.0  --   AST 48*  --   --   ALT 43  --   --   ALKPHOS 58  --   --   BILITOT 0.7  --   --    Cardiac Enzymes No results for input(s): TROPONINI in the last 168 hours. RADIOLOGY:  CT Head Wo Contrast  Result Date: 05/07/2019 CLINICAL DATA:  Fall, left eye bruising and swelling EXAM: CT HEAD WITHOUT CONTRAST TECHNIQUE: Contiguous axial images were obtained from the base of the skull through the vertex without intravenous contrast. COMPARISON:  CT facial bones, 03/08/2013 FINDINGS: Brain: No evidence of acute infarction, hemorrhage, hydrocephalus, extra-axial collection or mass lesion/mass effect. Vascular: No hyperdense vessel or unexpected calcification. Skull: Possible minimally displaced acute  on chronic fractures of the left nasal bones (series 2, image 7). Negative for fracture or focal lesion. Sinuses/Orbits: There is a small, hyperdense air-fluid level of the left maxillary sinus. There is irregularity of the left orbital floor, not optimally evaluated on this non tailored examination. There is a nonacute left orbital floor fracture better assessed on prior dedicated CT examination of the facial bones. Nonacute fracture of the right lamina papyracea. Other: Soft tissue contusion  about the left forehead, nose, left orbit, and left cheek. IMPRESSION: 1. No acute intracranial pathology 2. Possible minimally displaced acute on chronic fractures of the left nasal bones. 3. There is a small, hyperdense air-fluid level of the left maxillary sinus suspicious for hemorrhage. There is irregularity of the left orbital floor, not optimally evaluated on this non tailored examination. There is a nonacute left orbital floor fracture better assessed on prior dedicated CT examination of the facial bones. Recommend dedicated CT examination of the facial bones to better assess for acutely superimposed recurrent facial bone or orbital fracture. 4. Soft tissue contusion about the left forehead, nose, left orbit, and left cheek. Electronically Signed   By: Lauralyn Primes M.D.   On: 05/07/2019 16:29   ECHOCARDIOGRAM COMPLETE  Result Date: 05/08/2019   ECHOCARDIOGRAM REPORT   Patient Name:   Tyler [name] Date of Exam: 05/08/2019 Medical Rec #:  [id number]     Height:       68.0 in Accession #:    [id number]    Weight:       170.0 lb Date of Birth:  1970-10-11     BSA:          1.91 m Patient Age:    48 years      BP:           110/95 mmHg Patient Gender: M             HR:           146 bpm. Exam Location:  [facility] Procedure: 2D Echo, Color Doppler, Cardiac Doppler and Intracardiac            Opacification Agent Indications:     I48.92 Atrial Flutter  History:         Patient has no prior history of Echocardiogram examinations.                  Risk Factors:Hypertension.  Sonographer:     Humphrey Rolls RDCS (AE) Referring Phys:  [id number] Debbe Odea Diagnosing Phys: Debbe Odea MD  Sonographer Comments: TDS due to tachycardia. IMPRESSIONS  1. Left ventricular ejection fraction, by visual estimation, is 25 to 30%. The left ventricle has severely decreased function. There is no left ventricular hypertrophy.  2. Definity contrast agent was given IV to delineate the left ventricular endocardial borders.  3.  Indeterminate diastolic filling due to E-A fusion.  4. The left ventricle demonstrates global hypokinesis.  5. Global right ventricle has severely reduced systolic function.The right ventricular size is normal. Right vetricular wall thickness was not assessed.  6. Left atrial size was normal.  7. Right atrial size was normal.  8. The mitral valve is normal in structure. Trivial mitral valve regurgitation.  9. The tricuspid valve is normal in structure. 10. The aortic valve is normal in structure. Aortic valve regurgitation is not visualized. 11. The pulmonic valve was normal in structure. Pulmonic valve regurgitation is not visualized. 12. Normal pulmonary artery systolic pressure. 13. The inferior vena cava is normal in  size with <50% respiratory variability, suggesting right atrial pressure of 8 mmHg. FINDINGS  Left Ventricle: Left ventricular ejection fraction, by visual estimation, is 25 to 30%. The left ventricle has severely decreased function. Definity contrast agent was given IV to delineate the left ventricular endocardial borders. The left ventricle demonstrates global hypokinesis. The left ventricular internal cavity size was the left ventricle is normal in size. There is no left ventricular hypertrophy. Indeterminate diastolic filling due to E-A fusion. Right Ventricle: The right ventricular size is normal. Right vetricular wall thickness was not assessed. Global RV systolic function is has severely reduced systolic function. The tricuspid regurgitant velocity is 2.12 m/s, and with an assumed right atrial pressure of 8 mmHg, the estimated right ventricular systolic pressure is normal at 26.0 mmHg. Left Atrium: Left atrial size was normal in size. Right Atrium: Right atrial size was normal in size Pericardium: There is no evidence of pericardial effusion. Mitral Valve: The mitral valve is normal in structure. Trivial mitral valve regurgitation. MV peak gradient, 3.5 mmHg. Tricuspid Valve: The tricuspid  valve is normal in structure. Tricuspid valve regurgitation is not demonstrated. Aortic Valve: The aortic valve is normal in structure. Aortic valve regurgitation is not visualized. Aortic valve mean gradient measures 1.0 mmHg. Aortic valve peak gradient measures 1.0 mmHg. Aortic valve area, by VTI measures 3.10 cm. Pulmonic Valve: The pulmonic valve was normal in structure. Pulmonic valve regurgitation is not visualized. Pulmonic regurgitation is not visualized. Aorta: The aortic root is normal in size and structure. Venous: The inferior vena cava is normal in size with less than 50% respiratory variability, suggesting right atrial pressure of 8 mmHg. IAS/Shunts: No atrial level shunt detected by color flow Doppler.  LEFT VENTRICLE PLAX 2D LVIDd:         4.76 cm  Diastology LVIDs:         3.89 cm  LV e' lateral:   12.50 cm/s LV PW:         0.99 cm  LV E/e' lateral: 6.5 LV IVS:        0.79 cm  LV e' medial:    6.85 cm/s LVOT diam:     1.90 cm  LV E/e' medial:  11.9 LV SV:         40 ml LV SV Index:   20.63 LVOT Area:     2.84 cm  RIGHT VENTRICLE RV Basal diam:  3.23 cm LEFT ATRIUM             Index       RIGHT ATRIUM           Index LA diam:        3.30 cm 1.73 cm/m  RA Area:     16.70 cm LA Vol (A2C):   50.2 ml 26.32 ml/m RA Volume:   41.90 ml  21.97 ml/m LA Vol (A4C):   20.9 ml 10.96 ml/m LA Biplane Vol: 34.2 ml 17.93 ml/m  AORTIC VALVE                   PULMONIC VALVE AV Area (Vmax):    3.00 cm    PV Vmax:       0.49 m/s AV Area (Vmean):   2.74 cm    PV Vmean:      39.400 cm/s AV Area (VTI):     3.10 cm    PV VTI:        0.078 m AV Vmax:  50.30 cm/s  PV Peak grad:  1.0 mmHg AV Vmean:          36.000 cm/s PV Mean grad:  1.0 mmHg AV VTI:            0.060 m AV Peak Grad:      1.0 mmHg AV Mean Grad:      1.0 mmHg LVOT Vmax:         53.20 cm/s LVOT Vmean:        34.800 cm/s LVOT VTI:          0.066 m LVOT/AV VTI ratio: 1.09  AORTA Ao Root diam: 3.10 cm MITRAL VALVE                       TRICUSPID  VALVE MV Area (PHT): 6.94 cm            TR Peak grad:   18.0 mmHg MV Peak grad:  3.5 mmHg            TR Vmax:        212.00 cm/s MV Mean grad:  2.0 mmHg MV Vmax:       0.93 m/s            SHUNTS MV Vmean:      61.4 cm/s           Systemic VTI:  0.07 m MV VTI:        0.07 m              Systemic Diam: 1.90 cm MV PHT:        31.71 msec MV Decel Time: 109 msec MV E velocity: 81.33 cm/s 103 cm/s  Debbe Odea MD Electronically signed by Debbe Odea MD Signature Date/Time: 05/08/2019/11:11:18 AM    Final    ASSESSMENT AND PLAN:  Tyler [name] is a 49 y.o. male with significant past history of drug abuse, smoking for 30+ years comes to the ED today with recurrent fall experiencing recently.  Patient stated over the past couple of weeks he would feel short of breath suddenly and palpitation.   1) Atrial flutter--Rapid -Patient is currently on Cardizem drip, po digoxin and metoprolol per [facility] cardiology -HR remains in the 150's  -Patient's CHA2DS2-VASc score of 1 - However CT head shows possible left orbital floor fracture and also small hyperdense air-fluid level of the left maxillary sinus suspicious for hemorrhage.  CT head is negative for any acute findings including hemorrhage -patient will need to be on IV heparin drip over the weekend prior to cardioversion. 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  He also admits using marijuana occasionally.  But but says drinks alcohol very seldomly. -We will place patient on possible intoxication/withdrawal with caution with benzodiazepine -As needed antihypertensives -Counseled on cessation -Monitor closely  4) AKI: Creatinine found to be 1.29 today even though it was normal from 2014  -received IV fluids--  creatinine .9 -DC IV fluids -Monitor renal function -Avoid potential nephrotoxic's and contrast materials  5) smoking: Admits to smoking 30+ years of regular cigarettes -But he says he no longer smokes for the last couple of days -Counseled on cessation -Offered but denied nicotine patch for the time being   DVT Prophylaxis: Lovenox Code Status: Full code Family Communication: None at bedside Disposition Plan: Home when stable Consults: cardiology, ENT  TOTAL TIME TAKING CARE OF THIS PATIENT: **30* minutes.  >50% time spent on counselling and coordination of care  POSSIBLE D/C IN *?** DAYS, DEPENDING ON CLINICAL CONDITION.  Note: This dictation was prepared with Dragon dictation along with smaller phrase technology. Any transcriptional errors that result from this process are unintentional.  Enedina Finner M.D on 05/08/2019 at 2:09 PM  Between 7am to 6pm - Pager - [phone]  After 6pm go to [email or web address]  [facility]   CC: Primary care physician; Patient, No Pcp PerPatient ID: Tyler [name], male   DOB: [birth year], 49 y.o.   MRN: [id number]

## 2019-05-08 NOTE — Progress Notes (Addendum)
Update given to patient's wife per patient request. Patient's wife advised of visitation, reported no questions. Patient is still currently in afib, rate is controlled now. Patient started complaining of trouble breathing earlier today, he said it felt like he had something sitting on his chest. Lung sounds auscultated, mild wheezing. Dr. Karna Christmas ordered a chest xray and a duoneb. the patient had some relief from the duoneb. he is still complaining that it's hard for him to breathe. Xanax given also because he said he felt mildly anxious. Little improvement, increased RR, Dr. Karna Christmas ordered 1 mg of morphine. Will reassess patient. Gery Pray

## 2019-05-08 NOTE — Progress Notes (Signed)
Patient still complaining of trouble breathing, he states he feels like he's "suffocating". Dr. Allena Katz notified, EF 25-30%, one time dose of lasix ordered and given. RT notified and they're going to place patient on venti mask for extra flow and comfort for now. Patient updated on plan of care. Will continue to assess. Tyler [name]

## 2019-05-08 NOTE — Consult Note (Signed)
Tyler [name], Tyler [name] [id number] [birth year] [name], MD  Reason for Consult: ?Left maxillary sinus hemorrhage on CT, need anticoagulation  HPI:  Mr. Asch is a 49 year-old male who presented following a syncopal episode and fall. He noted heart palpitations and was found to be in atrial flutter on EKG with rate in 150s. He repots that he hit his face during the fall and has a black eye. He had mild epistaxis following the incident which resolved. He has prior hx of facial fracture, but denies prior surgical repair. He is able to breathe though his nose well. No changes in his vision or double vision. He reports normal occlusion. ENT consulted re: CT read with suspicion for left maxillary sinus hemorrhage.   Allergies: No Known Allergies  ROS: Review of systems normal other than 12 systems except per HPI.  PMH:  Past Medical History:  Diagnosis Date  . Hypertension     FH: History reviewed. No pertinent family history.  SH:  Social History   Socioeconomic History  . Marital status: Married    Spouse name: Not on file  . Number of children: Not on file  . Years of education: Not on file  . Highest education level: Not on file  Occupational History  . Not on file  Tobacco Use  . Smoking status: Current Every Day Smoker    Packs/day: 1.00  . Smokeless tobacco: Never Used  Substance and Sexual Activity  . Alcohol use: Yes    Comment: beer daily  . Drug use: Yes    Types: Methamphetamines  . Sexual activity: Not on file  Other Topics Concern  . Not on file  Social History Narrative  . Not on file   Social Determinants of Health   Financial Resource Strain:   . Difficulty of Paying Living Expenses: Not on file  Food Insecurity:   . Worried About Charity fundraiser in the Last Year: Not on file  . Ran Out of Food in the Last Year: Not on file  Transportation Needs:   . Lack of Transportation (Medical): Not on file  . Lack of Transportation (Non-Medical): Not on file   Physical Activity:   . Days of Exercise per Week: Not on file  . Minutes of Exercise per Session: Not on file  Stress:   . Feeling of Stress : Not on file  Social Connections:   . Frequency of Communication with Friends and Family: Not on file  . Frequency of Social Gatherings with Friends and Family: Not on file  . Attends Religious Services: Not on file  . Active Member of Clubs or Organizations: Not on file  . Attends Archivist Meetings: Not on file  . Marital Status: Not on file  Intimate Partner Violence:   . Fear of Current or Ex-Partner: Not on file  . Emotionally Abused: Not on file  . Physically Abused: Not on file  . Sexually Abused: Not on file    PSH:  Past Surgical History:  Procedure Laterality Date  . NO PAST SURGERIES      Physical  Exam: CN 2-12 grossly intact and symmetric. Left periorbital ecchymosis and edema. EOMI. No step-offs. Mild tenderness over edematous area. No hematoma. No nasal step-offs, and mild edema extending to the left lateral nose. No epistaxis.  Oral cavity, lips, gums, ororpharynx normal with no masses or lesions. Poor dentition. Oropharynx is clear without blood. Skin warm and dry. Neck supple with no masses or lesions. No lymphadenopathy palpated.  Thyroid normal with no masses.   CT head without contrast: IMPRESSION: 1. No acute intracranial pathology 2. Possible minimally displaced acute on chronic fractures of the left nasal bones. 3. There is a small, hyperdense air-fluid level of the left maxillary sinus suspicious for hemorrhage. There is irregularity of the left orbital floor, not optimally evaluated on this non tailored examination. There is a nonacute left orbital floor fracture better assessed on prior dedicated CT examination of the facial bones. Recommend dedicated CT examination of the facial bones to better assess for acutely superimposed recurrent facial bone or orbital fracture. 4. Soft tissue contusion  about the left forehead, nose, left orbit, and left cheek.  Personally reviewed.  Limited study for complete facial trauma evaluation given CT head.  Left air-fluid level in left maxillary sinus, hyperdense.  Unclear chronicity of left orbital fracture.  Right lamina fracture, appears chronic.    A/P: 20M with syncopal episode and fall resulting in left periorbital edema and ecchymosis with suspicion for left maxillary sinus blood on CT. He has no active epistaxis and periorbital ecchymosis is soft without hematoma. No stepoffs on exam. He requires anticoagulation for atrial flutter previously with a rate in the 150s. There is currently no contraindication for anticoagulation.   - I recommend starting with heparin which may be more easily reversible in case of severe epistaxis. If he tolerates therapeutic heparin, may transition to other modalities. - Recommend afrin spray in case of mild epistaxis.  - Cold compress to left eye   Tamela Gammon 05/08/2019 2:48 PM

## 2019-05-08 NOTE — Progress Notes (Signed)
Progress Note  Patient Name: Tyler [name] Date of Encounter: 05/08/2019  Primary Cardiologist: [facility] rounding  Subjective   No acute events overnight.  Still having palpitations.  Inpatient Medications    Scheduled Meds: . Chlorhexidine Gluconate Cloth  6 each Topical Daily  . enoxaparin (LOVENOX) injection  40 mg Subcutaneous Q24H  . folic acid  1 mg Oral Daily  . metoprolol tartrate  25 mg Oral Q8H  . multivitamin with minerals  1 tablet Oral Daily  . potassium chloride  20 mEq Oral BID  . sodium chloride flush  3 mL Intravenous Q12H  . thiamine  100 mg Oral Daily   Or  . thiamine  100 mg Intravenous Daily   Continuous Infusions: . sodium chloride 75 mL/hr at 05/07/19 1841  . diltiazem (CARDIZEM) infusion 15 mg/hr (05/08/19 1000)   PRN Meds: acetaminophen, ALPRAZolam, HYDROcodone-acetaminophen, labetalol, LORazepam **OR** LORazepam, ondansetron (ZOFRAN) IV   Vital Signs    Vitals:   05/08/19 0830 05/08/19 0845 05/08/19 0900 05/08/19 1000  BP: (!) 127/104  121/89 (!) 128/97  Pulse: (!) 146 (!) 149 (!) 147 (!) 148  Resp: 14 11 19 19   Temp:    98.1 F (36.7 C)  TempSrc:    Oral  SpO2: 95% 94% (!) 89% 93%  Weight:      Height:        Intake/Output Summary (Last 24 hours) at 05/08/2019 1112 Last data filed at 05/08/2019 1019 Gross per 24 hour  Intake 1248.63 ml  Output --  Net 1248.63 ml   Last 3 Weights 05/07/2019 04/18/2016  Weight (lbs) 170 lb 165 lb  Weight (kg) 77.111 kg 74.844 kg      Telemetry    Atrial flutter heart rate 146- Personally Reviewed  ECG    No new ECG- Personally Reviewed  Physical Exam   GEN: No acute distress.   Neck: No JVD, left eye bruise noted Cardiac:  Tachycardic, regular, no murmurs, rubs, or gallops.  Respiratory: Clear to auscultation bilaterally. GI: Soft, nontender, non-distended  MS: No edema; No deformity. Neuro:  Nonfocal  Psych: Normal affect   Labs    High Sensitivity Troponin:   Recent Labs   Lab 05/07/19 1219  TROPONINIHS 87*      Chemistry Recent Labs  Lab 05/07/19 1219 05/08/19 0538  NA 141 140  K 3.7 4.3  CL 107 105  CO2 25 25  GLUCOSE 116* 108*  BUN 15 12  CREATININE 1.29* 0.91  CALCIUM 8.6* 8.9  PROT 6.7  --   ALBUMIN 4.0  --   AST 48*  --   ALT 43  --   ALKPHOS 58  --   BILITOT 0.7  --   GFRNONAA >60 >60  GFRAA >60 >60  ANIONGAP 9 10     Hematology Recent Labs  Lab 05/07/19 1219 05/07/19 1730 05/08/19 0538  WBC 13.4* 12.2* 10.6*  RBC 5.00 5.05 4.88  HGB 15.2 15.4 15.0  HCT 46.6 44.8 43.6  MCV 93.2 88.7 89.3  MCH 30.4 30.5 30.7  MCHC 32.6 34.4 34.4  RDW 12.9 12.9 13.1  PLT 242 268 265    BNPNo results for input(s): BNP, PROBNP in the last 168 hours.   DDimer No results for input(s): DDIMER in the last 168 hours.   Radiology    CT Head Wo Contrast  Result Date: 05/07/2019 CLINICAL DATA:  Fall, left eye bruising and swelling EXAM: CT HEAD WITHOUT CONTRAST TECHNIQUE: Contiguous axial images were obtained from  the base of the skull through the vertex without intravenous contrast. COMPARISON:  CT facial bones, 03/08/2013 FINDINGS: Brain: No evidence of acute infarction, hemorrhage, hydrocephalus, extra-axial collection or mass lesion/mass effect. Vascular: No hyperdense vessel or unexpected calcification. Skull: Possible minimally displaced acute on chronic fractures of the left nasal bones (series 2, image 7). Negative for fracture or focal lesion. Sinuses/Orbits: There is a small, hyperdense air-fluid level of the left maxillary sinus. There is irregularity of the left orbital floor, not optimally evaluated on this non tailored examination. There is a nonacute left orbital floor fracture better assessed on prior dedicated CT examination of the facial bones. Nonacute fracture of the right lamina papyracea. Other: Soft tissue contusion about the left forehead, nose, left orbit, and left cheek. IMPRESSION: 1. No acute intracranial pathology 2.  Possible minimally displaced acute on chronic fractures of the left nasal bones. 3. There is a small, hyperdense air-fluid level of the left maxillary sinus suspicious for hemorrhage. There is irregularity of the left orbital floor, not optimally evaluated on this non tailored examination. There is a nonacute left orbital floor fracture better assessed on prior dedicated CT examination of the facial bones. Recommend dedicated CT examination of the facial bones to better assess for acutely superimposed recurrent facial bone or orbital fracture. 4. Soft tissue contusion about the left forehead, nose, left orbit, and left cheek. Electronically Signed   By: Lauralyn Primes M.D.   On: 05/07/2019 16:29   ECHOCARDIOGRAM COMPLETE  Result Date: 05/08/2019   ECHOCARDIOGRAM REPORT   Patient Name:   Tyler [name] Date of Exam: 05/08/2019 Medical Rec #:  [id number]     Height:       68.0 in Accession #:    [id number]    Weight:       170.0 lb Date of Birth:  [birth year]     BSA:          1.91 m Patient Age:    49 years      BP:           110/95 mmHg Patient Gender: M             HR:           146 bpm. Exam Location:  [facility] Procedure: 2D Echo, Color Doppler, Cardiac Doppler and Intracardiac            Opacification Agent Indications:     I48.92 Atrial Flutter  History:         Patient has no prior history of Echocardiogram examinations.                  Risk Factors:Hypertension.  Sonographer:     Humphrey Rolls RDCS (AE) Referring Phys:  [id number] Debbe Odea Diagnosing Phys: Debbe Odea MD  Sonographer Comments: TDS due to tachycardia. IMPRESSIONS  1. Left ventricular ejection fraction, by visual estimation, is 25 to 30%. The left ventricle has severely decreased function. There is no left ventricular hypertrophy.  2. Definity contrast agent was given IV to delineate the left ventricular endocardial borders.  3. Indeterminate diastolic filling due to E-A fusion.  4. The left ventricle demonstrates global hypokinesis.  5.  Global right ventricle has severely reduced systolic function.The right ventricular size is normal. Right vetricular wall thickness was not assessed.  6. Left atrial size was normal.  7. Right atrial size was normal.  8. The mitral valve is normal in structure. Trivial mitral valve regurgitation.  9. The tricuspid valve is normal  in structure. 10. The aortic valve is normal in structure. Aortic valve regurgitation is not visualized. 11. The pulmonic valve was normal in structure. Pulmonic valve regurgitation is not visualized. 12. Normal pulmonary artery systolic pressure. 13. The inferior vena cava is normal in size with <50% respiratory variability, suggesting right atrial pressure of 8 mmHg. FINDINGS  Left Ventricle: Left ventricular ejection fraction, by visual estimation, is 25 to 30%. The left ventricle has severely decreased function. Definity contrast agent was given IV to delineate the left ventricular endocardial borders. The left ventricle demonstrates global hypokinesis. The left ventricular internal cavity size was the left ventricle is normal in size. There is no left ventricular hypertrophy. Indeterminate diastolic filling due to E-A fusion. Right Ventricle: The right ventricular size is normal. Right vetricular wall thickness was not assessed. Global RV systolic function is has severely reduced systolic function. The tricuspid regurgitant velocity is 2.12 m/s, and with an assumed right atrial pressure of 8 mmHg, the estimated right ventricular systolic pressure is normal at 26.0 mmHg. Left Atrium: Left atrial size was normal in size. Right Atrium: Right atrial size was normal in size Pericardium: There is no evidence of pericardial effusion. Mitral Valve: The mitral valve is normal in structure. Trivial mitral valve regurgitation. MV peak gradient, 3.5 mmHg. Tricuspid Valve: The tricuspid valve is normal in structure. Tricuspid valve regurgitation is not demonstrated. Aortic Valve: The aortic valve  is normal in structure. Aortic valve regurgitation is not visualized. Aortic valve mean gradient measures 1.0 mmHg. Aortic valve peak gradient measures 1.0 mmHg. Aortic valve area, by VTI measures 3.10 cm. Pulmonic Valve: The pulmonic valve was normal in structure. Pulmonic valve regurgitation is not visualized. Pulmonic regurgitation is not visualized. Aorta: The aortic root is normal in size and structure. Venous: The inferior vena cava is normal in size with less than 50% respiratory variability, suggesting right atrial pressure of 8 mmHg. IAS/Shunts: No atrial level shunt detected by color flow Doppler.  LEFT VENTRICLE PLAX 2D LVIDd:         4.76 cm  Diastology LVIDs:         3.89 cm  LV e' lateral:   12.50 cm/s LV PW:         0.99 cm  LV E/e' lateral: 6.5 LV IVS:        0.79 cm  LV e' medial:    6.85 cm/s LVOT diam:     1.90 cm  LV E/e' medial:  11.9 LV SV:         40 ml LV SV Index:   20.63 LVOT Area:     2.84 cm  RIGHT VENTRICLE RV Basal diam:  3.23 cm LEFT ATRIUM             Index       RIGHT ATRIUM           Index LA diam:        3.30 cm 1.73 cm/m  RA Area:     16.70 cm LA Vol (A2C):   50.2 ml 26.32 ml/m RA Volume:   41.90 ml  21.97 ml/m LA Vol (A4C):   20.9 ml 10.96 ml/m LA Biplane Vol: 34.2 ml 17.93 ml/m  AORTIC VALVE                   PULMONIC VALVE AV Area (Vmax):    3.00 cm    PV Vmax:       0.49 m/s AV Area (Vmean):   2.74 cm  PV Vmean:      39.400 cm/s AV Area (VTI):     3.10 cm    PV VTI:        0.078 m AV Vmax:           50.30 cm/s  PV Peak grad:  1.0 mmHg AV Vmean:          36.000 cm/s PV Mean grad:  1.0 mmHg AV VTI:            0.060 m AV Peak Grad:      1.0 mmHg AV Mean Grad:      1.0 mmHg LVOT Vmax:         53.20 cm/s LVOT Vmean:        34.800 cm/s LVOT VTI:          0.066 m LVOT/AV VTI ratio: 1.09  AORTA Ao Root diam: 3.10 cm MITRAL VALVE                       TRICUSPID VALVE MV Area (PHT): 6.94 cm            TR Peak grad:   18.0 mmHg MV Peak grad:  3.5 mmHg            TR Vmax:         212.00 cm/s MV Mean grad:  2.0 mmHg MV Vmax:       0.93 m/s            SHUNTS MV Vmean:      61.4 cm/s           Systemic VTI:  0.07 m MV VTI:        0.07 m              Systemic Diam: 1.90 cm MV PHT:        31.71 msec MV Decel Time: 109 msec MV E velocity: 81.33 cm/s 103 cm/s  Debbe Odea MD Electronically signed by Debbe Odea MD Signature Date/Time: 05/08/2019/11:11:18 AM    Final     Cardiac Studies   TTE 05/07/2018 1. Left ventricular ejection fraction, by visual estimation, is 25 to 30%. The left ventricle has severely decreased function. There is no left ventricular hypertrophy.  2. Definity contrast agent was given IV to delineate the left ventricular endocardial borders.  3. Indeterminate diastolic filling due to E-A fusion.  4. The left ventricle demonstrates global hypokinesis.  5. Global right ventricle has severely reduced systolic function.The right ventricular size is normal. Right vetricular wall thickness was not assessed.  6. Left atrial size was normal.  7. Right atrial size was normal.  8. The mitral valve is normal in structure. Trivial mitral valve regurgitation.  9. The tricuspid valve is normal in structure. 10. The aortic valve is normal in structure. Aortic valve regurgitation is not visualized. 11. The pulmonic valve was normal in structure. Pulmonic valve regurgitation is not visualized. 12. Normal pulmonary artery systolic pressure. 13. The inferior vena cava is normal in size with <50% respiratory variability, suggesting right atrial pressure of 8 mmHg  Patient Profile     49 y.o. male with history of hypertension, meth use, 30+ year smoker, presenting with shortness of breath and tachycardia after a syncopal episode.  Found to be in atrial flutter with difficult to control heart rates.  CHA2DS2-VASc score equals 2 (CHF, htn).  Echocardiogram today revealed severely reduced EF.    Assessment & Plan    Echocardiogram shows  severely reduced ejection  fraction with EF 25 to 30%.  Etiology for reduced ejection fraction is likely meth use.  He is euvolemic on my exam.  However, formal ischemic work-up (Lexi or left heart cath) will have to be performed after heart rates are adequately controlled.  Patient still tachycardic with heart rate in the 140s on Cardizem drip.  His head CT showed hyperdense fluid level in the left maxillary sinus suspicious for hemorrhage.  Recommend ENT input regarding safety of anticoagulation.  Patient will need to be anticoagulated if TEE and cardioversion is to be performed, hopefully on Monday.  1.  Atrial flutter, typical counterclockwise -Cardizem drip okay for now -Will increase Lopressor to 25 mg q6h daily -Will do a trial of digoxin load of 0.25 mg every 6 hours x3 doses -Check dig levels in the a.m. -Recommend primary team gets ENT input regarding maxillary sinus hemorrhage and anticoagulation.  Patient needs to be started on anticoagulation if cardioversion with TEE guidance is to be performed.  2.  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  Okay to continue for now. -Plan to switch beta-blocker to Toprol-XL after tachycardia is managed.  Plan to add other chf meds pending clinical course.  3.  Syncope -Work-up as per primary    Signed, Debbe Odea, MD  05/08/2019, 11:12 AM

## 2019-05-08 NOTE — ED Notes (Signed)
Pt wife called  And updated. Pt wife left her phone number which is in the chart.

## 2019-05-08 NOTE — Progress Notes (Signed)
*  PRELIMINARY RESULTS* Echocardiogram 2D Echocardiogram has been performed.  Tyler [name] Tyler [name] 05/08/2019, 9:12 AM

## 2019-05-08 NOTE — Progress Notes (Signed)
Assessed patient appx an hour ago.  He was resting comfortably with eyes closed and nasal cannula. O2 sats in mid 90's remaining vitals stable. Discussed with current RN will leave patient as is for now since in no distress and resting well.

## 2019-05-09 DIAGNOSIS — I429 Cardiomyopathy, unspecified: Secondary | ICD-10-CM

## 2019-05-09 DIAGNOSIS — I483 Typical atrial flutter: Secondary | ICD-10-CM

## 2019-05-09 LAB — HEPARIN LEVEL (UNFRACTIONATED)
Heparin Unfractionated: 0.19 IU/mL — ABNORMAL LOW (ref 0.30–0.70)
Heparin Unfractionated: 0.16 IU/mL — ABNORMAL LOW (ref 0.30–0.70)
Heparin Unfractionated: 0.34 IU/mL (ref 0.30–0.70)

## 2019-05-09 LAB — CBC
HCT: 49.7 % (ref 39.0–52.0)
Hemoglobin: 16.5 g/dL (ref 13.0–17.0)
MCH: 30.3 pg (ref 26.0–34.0)
MCHC: 33.2 g/dL (ref 30.0–36.0)
MCV: 91.4 fL (ref 80.0–100.0)
Platelets: 238 10*3/uL (ref 150–400)
RBC: 5.44 MIL/uL (ref 4.22–5.81)
RDW: 13.1 % (ref 11.5–15.5)
WBC: 12.6 10*3/uL — ABNORMAL HIGH (ref 4.0–10.5)
nRBC: 0 % (ref 0.0–0.2)

## 2019-05-09 LAB — GLUCOSE, CAPILLARY
Glucose-Capillary: 90 mg/dL (ref 70–99)
Glucose-Capillary: 111 mg/dL — ABNORMAL HIGH (ref 70–99)

## 2019-05-09 LAB — DIGOXIN LEVEL: Digoxin Level: 1 ng/mL (ref 0.8–2.0)

## 2019-05-09 MED ORDER — METOPROLOL TARTRATE 25 MG PO TABS
25.0000 mg | ORAL_TABLET | Freq: Once | ORAL | Status: AC
  Administered 2019-05-09: 25 mg via ORAL

## 2019-05-09 MED ORDER — INFLUENZA VAC SPLIT QUAD 0.5 ML IM SUSY: 0.5000 mL | PREFILLED_SYRINGE | INTRAMUSCULAR | Status: DC

## 2019-05-09 MED ORDER — METOPROLOL SUCCINATE ER 50 MG PO TB24
50.0000 mg | ORAL_TABLET | Freq: Every day | ORAL | Status: DC
  Administered 2019-05-09: 20:00:00 50 mg via ORAL
  Filled 2019-05-09: qty 1

## 2019-05-09 MED ORDER — METOPROLOL TARTRATE 5 MG/5ML IV SOLN
INTRAVENOUS | Status: AC
  Administered 2019-05-09: 5 mg
  Filled 2019-05-09: qty 5

## 2019-05-09 MED ORDER — METOPROLOL TARTRATE 5 MG/5ML IV SOLN: 5.0000 mg | Freq: Once | INTRAVENOUS | Status: AC

## 2019-05-09 MED ORDER — HEPARIN BOLUS VIA INFUSION
2000.0000 [IU] | Freq: Once | INTRAVENOUS | Status: DC
  Filled 2019-05-09: qty 2000

## 2019-05-09 MED ORDER — LOSARTAN POTASSIUM 25 MG PO TABS
25.0000 mg | ORAL_TABLET | Freq: Every day | ORAL | Status: DC
  Administered 2019-05-09 – 2019-05-10 (×2): 25 mg via ORAL
  Filled 2019-05-09 (×2): qty 1

## 2019-05-09 NOTE — Progress Notes (Addendum)
ANTICOAGULATION CONSULT NOTE - Initial Consult  Pharmacy Consult for Heparin  Indication: atrial fibrillation  No Known Allergies  Patient Measurements: Height: 5\' 8"  (172.7 cm) Weight: 170 lb (77.1 kg) IBW/kg (Calculated) : 68.4 Heparin Dosing Weight: 77.1 kg  Vital Signs: Temp: 98.1 F (36.7 C) (01/01 2000) Temp Source: Oral (01/01 2000) BP: 119/84 (01/01 2100) Pulse Rate: 78 (01/01 2100)  Labs: Recent Labs    05/07/19 1219 05/07/19 1730 05/08/19 0538 05/08/19 1527 05/08/19 2213  HGB 15.2 15.4 15.0  --   --   HCT 46.6 44.8 43.6  --   --   PLT 242 268 265  --   --   APTT  --   --   --  28  --   LABPROT  --   --  12.6  --   --   INR  --   --  1.0  --   --   HEPARINUNFRC  --   --   --   --  0.14*  CREATININE 1.29*  --  0.91  --   --   TROPONINIHS 87*  --   --   --   --     Estimated Creatinine Clearance: 98.1 mL/min (by C-G formula based on SCr of 0.91 mg/dL).   Medical History: Past Medical History:  Diagnosis Date  . Hypertension      Assessment: 49 yo male to start heparin drip for atrial flutter with RVR. No PTA meds. Per heparin consult, NO bolus.   Goal of Therapy:  Heparin level 0.3-0.7 units/ml Monitor platelets by anticoagulation protocol: Yes   Plan:  01/01 @ 2200 HL 0.14 subtherapeutic. Will increase rate to 1250 units/hr w/o bolus and recheck HL w/ am labs, CBC stable will continue to monitor.  03/01, PharmD, BCPS Clinical Pharmacist 05/09/2019,12:01 AM

## 2019-05-09 NOTE — Progress Notes (Signed)
Patient remains in SR off cardizem infusion.  Hemodynamically stable. Remains on IV heparin for anticoagulation. He is stable for transfer to telemetry bed.

## 2019-05-09 NOTE — Progress Notes (Signed)
[facility]  -  at [facility]   PATIENT NAME: Tyler [name]    MR#:  [id number]  DATE OF BIRTH:  [birth year]  SUBJECTIVE:  patient came in after he had a syncopal episode and collapse. He has black eye. Found to be in atrial flutter currently on IV Cardizem drip. Heart rate remains in the 150s. Denies any chest pain.  Patient earlier was in the 140s 150s heart rate. Now converted to sinus rhythm spontaneously heart rate in the 70s. Cardizem drip is being weaned continue IV heparin drip REVIEW OF SYSTEMS:   Review of Systems  Constitutional: Negative for chills, fever and weight loss.  HENT: Negative for ear discharge, ear pain and nosebleeds.   Eyes: Negative for blurred vision, pain and discharge.  Respiratory: Negative for sputum production, shortness of breath, wheezing and stridor.   Cardiovascular: Positive for palpitations. Negative for chest pain, orthopnea and PND.  Gastrointestinal: Negative for abdominal pain, diarrhea, nausea and vomiting.  Genitourinary: Negative for frequency and urgency.  Musculoskeletal: Negative for back pain and joint pain.  Neurological: Positive for weakness. Negative for sensory change, speech change and focal weakness.  Psychiatric/Behavioral: Negative for depression and hallucinations. The patient is not nervous/anxious.    Tolerating Diet:yes Tolerating PT: not needed  DRUG ALLERGIES:  No Known Allergies  VITALS:  Blood pressure (!) 125/95, pulse 76, temperature 98.5 F (36.9 C), temperature source Oral, resp. rate (!) 24, height 5\' 8"  (1.727 m), weight 74.7 kg, SpO2 98 %.  PHYSICAL EXAMINATION:   Physical Exam  GENERAL:  49 y.o.-year-old patient lying in the bed with no acute distress.  EYES: Pupils equal, round, reactive to light and accommodation. Left black eye with swelling HEENT: Head atraumatic, normocephalic. Oropharynx and nasopharynx clear.  NECK:  Supple, no jugular venous distention. No thyroid  enlargement, no tenderness.  LUNGS: Normal breath sounds bilaterally, no wheezing, rales, rhonchi. No use of accessory muscles of respiration.  CARDIOVASCULAR: S1, S2 normal. No murmurs, rubs, or gallops. Tachycardia ABDOMEN: Soft, nontender, nondistended. Bowel sounds present. No organomegaly or mass.  EXTREMITIES: No cyanosis, clubbing or edema b/l.    NEUROLOGIC: Cranial nerves II through XII are intact. No focal Motor or sensory deficits b/l.   PSYCHIATRIC:  patient is alert and oriented x 3.  SKIN: No obvious rash, lesion, or ulcer.   LABORATORY PANEL:  CBC Recent Labs  Lab 05/09/19 0540  WBC 12.6*  HGB 16.5  HCT 49.7  PLT 238    Chemistries  Recent Labs  Lab 05/07/19 1219 05/07/19 1220 05/08/19 0538  NA 141  --  140  K 3.7  --  4.3  CL 107  --  105  CO2 25  --  25  GLUCOSE 116*  --  108*  BUN 15  --  12  CREATININE 1.29*  --  0.91  CALCIUM 8.6*  --  8.9  MG  --  2.0  --   AST 48*  --   --   ALT 43  --   --   ALKPHOS 58  --   --   BILITOT 0.7  --   --    Cardiac Enzymes No results for input(s): TROPONINI in the last 168 hours. RADIOLOGY:  CT Head Wo Contrast  Result Date: 05/07/2019 CLINICAL DATA:  Fall, left eye bruising and swelling EXAM: CT HEAD WITHOUT CONTRAST TECHNIQUE: Contiguous axial images were obtained from the base of the skull through the vertex without intravenous contrast. COMPARISON:  CT facial  bones, 03/08/2013 FINDINGS: Brain: No evidence of acute infarction, hemorrhage, hydrocephalus, extra-axial collection or mass lesion/mass effect. Vascular: No hyperdense vessel or unexpected calcification. Skull: Possible minimally displaced acute on chronic fractures of the left nasal bones (series 2, image 7). Negative for fracture or focal lesion. Sinuses/Orbits: There is a small, hyperdense air-fluid level of the left maxillary sinus. There is irregularity of the left orbital floor, not optimally evaluated on this non tailored examination. There is a  nonacute left orbital floor fracture better assessed on prior dedicated CT examination of the facial bones. Nonacute fracture of the right lamina papyracea. Other: Soft tissue contusion about the left forehead, nose, left orbit, and left cheek. IMPRESSION: 1. No acute intracranial pathology 2. Possible minimally displaced acute on chronic fractures of the left nasal bones. 3. There is a small, hyperdense air-fluid level of the left maxillary sinus suspicious for hemorrhage. There is irregularity of the left orbital floor, not optimally evaluated on this non tailored examination. There is a nonacute left orbital floor fracture better assessed on prior dedicated CT examination of the facial bones. Recommend dedicated CT examination of the facial bones to better assess for acutely superimposed recurrent facial bone or orbital fracture. 4. Soft tissue contusion about the left forehead, nose, left orbit, and left cheek. Electronically Signed   By: Lauralyn Primes M.D.   On: 05/07/2019 16:29   DG Chest Port 1 View  Result Date: 05/08/2019 CLINICAL DATA:  SOB. Patient admitted after LOC, tachycardia, atrial flutter. Current smoker, methamphetamine user. EXAM: PORTABLE CHEST 1 VIEW COMPARISON:  None. FINDINGS: The heart size and mediastinal contours are within normal limits. There is coarsening of the interstitium bilaterally likely representing chronic bronchitic change. No focal infiltrate. No pneumothorax or large pleural effusion. Subacute to chronic right lateral rib fracture. IMPRESSION: 1. No acute cardiopulmonary process. 2. Chronic bronchitic change. Electronically Signed   By: Emmaline Kluver M.D.   On: 05/08/2019 15:35   ECHOCARDIOGRAM COMPLETE  Result Date: 05/08/2019   ECHOCARDIOGRAM REPORT   Patient Name:   Tyler [name] Date of Exam: 05/08/2019 Medical Rec #:  [id number]     Height:       68.0 in Accession #:    [id number]    Weight:       170.0 lb Date of Birth:  April 10, 1971     BSA:          1.91 m Patient  Age:    49 years      BP:           110/95 mmHg Patient Gender: M             HR:           146 bpm. Exam Location:  [facility] Procedure: 2D Echo, Color Doppler, Cardiac Doppler and Intracardiac            Opacification Agent Indications:     I48.92 Atrial Flutter  History:         Patient has no prior history of Echocardiogram examinations.                  Risk Factors:Hypertension.  Sonographer:     Humphrey Rolls RDCS (AE) Referring Phys:  [id number] Debbe Odea Diagnosing Phys: Debbe Odea MD  Sonographer Comments: TDS due to tachycardia. IMPRESSIONS  1. Left ventricular ejection fraction, by visual estimation, is 25 to 30%. The left ventricle has severely decreased function. There is no left ventricular hypertrophy.  2. Definity contrast agent was  given IV to delineate the left ventricular endocardial borders.  3. Indeterminate diastolic filling due to E-A fusion.  4. The left ventricle demonstrates global hypokinesis.  5. Global right ventricle has severely reduced systolic function.The right ventricular size is normal. Right vetricular wall thickness was not assessed.  6. Left atrial size was normal.  7. Right atrial size was normal.  8. The mitral valve is normal in structure. Trivial mitral valve regurgitation.  9. The tricuspid valve is normal in structure. 10. The aortic valve is normal in structure. Aortic valve regurgitation is not visualized. 11. The pulmonic valve was normal in structure. Pulmonic valve regurgitation is not visualized. 12. Normal pulmonary artery systolic pressure. 13. The inferior vena cava is normal in size with <50% respiratory variability, suggesting right atrial pressure of 8 mmHg. FINDINGS  Left Ventricle: Left ventricular ejection fraction, by visual estimation, is 25 to 30%. The left ventricle has severely decreased function. Definity contrast agent was given IV to delineate the left ventricular endocardial borders. The left ventricle demonstrates global hypokinesis. The left  ventricular internal cavity size was the left ventricle is normal in size. There is no left ventricular hypertrophy. Indeterminate diastolic filling due to E-A fusion. Right Ventricle: The right ventricular size is normal. Right vetricular wall thickness was not assessed. Global RV systolic function is has severely reduced systolic function. The tricuspid regurgitant velocity is 2.12 m/s, and with an assumed right atrial pressure of 8 mmHg, the estimated right ventricular systolic pressure is normal at 26.0 mmHg. Left Atrium: Left atrial size was normal in size. Right Atrium: Right atrial size was normal in size Pericardium: There is no evidence of pericardial effusion. Mitral Valve: The mitral valve is normal in structure. Trivial mitral valve regurgitation. MV peak gradient, 3.5 mmHg. Tricuspid Valve: The tricuspid valve is normal in structure. Tricuspid valve regurgitation is not demonstrated. Aortic Valve: The aortic valve is normal in structure. Aortic valve regurgitation is not visualized. Aortic valve mean gradient measures 1.0 mmHg. Aortic valve peak gradient measures 1.0 mmHg. Aortic valve area, by VTI measures 3.10 cm. Pulmonic Valve: The pulmonic valve was normal in structure. Pulmonic valve regurgitation is not visualized. Pulmonic regurgitation is not visualized. Aorta: The aortic root is normal in size and structure. Venous: The inferior vena cava is normal in size with less than 50% respiratory variability, suggesting right atrial pressure of 8 mmHg. IAS/Shunts: No atrial level shunt detected by color flow Doppler.  LEFT VENTRICLE PLAX 2D LVIDd:         4.76 cm  Diastology LVIDs:         3.89 cm  LV e' lateral:   12.50 cm/s LV PW:         0.99 cm  LV E/e' lateral: 6.5 LV IVS:        0.79 cm  LV e' medial:    6.85 cm/s LVOT diam:     1.90 cm  LV E/e' medial:  11.9 LV SV:         40 ml LV SV Index:   20.63 LVOT Area:     2.84 cm  RIGHT VENTRICLE RV Basal diam:  3.23 cm LEFT ATRIUM             Index        RIGHT ATRIUM           Index LA diam:        3.30 cm 1.73 cm/m  RA Area:     16.70 cm LA Vol (  A2C):   50.2 ml 26.32 ml/m RA Volume:   41.90 ml  21.97 ml/m LA Vol (A4C):   20.9 ml 10.96 ml/m LA Biplane Vol: 34.2 ml 17.93 ml/m  AORTIC VALVE                   PULMONIC VALVE AV Area (Vmax):    3.00 cm    PV Vmax:       0.49 m/s AV Area (Vmean):   2.74 cm    PV Vmean:      39.400 cm/s AV Area (VTI):     3.10 cm    PV VTI:        0.078 m AV Vmax:           50.30 cm/s  PV Peak grad:  1.0 mmHg AV Vmean:          36.000 cm/s PV Mean grad:  1.0 mmHg AV VTI:            0.060 m AV Peak Grad:      1.0 mmHg AV Mean Grad:      1.0 mmHg LVOT Vmax:         53.20 cm/s LVOT Vmean:        34.800 cm/s LVOT VTI:          0.066 m LVOT/AV VTI ratio: 1.09  AORTA Ao Root diam: 3.10 cm MITRAL VALVE                       TRICUSPID VALVE MV Area (PHT): 6.94 cm            TR Peak grad:   18.0 mmHg MV Peak grad:  3.5 mmHg            TR Vmax:        212.00 cm/s MV Mean grad:  2.0 mmHg MV Vmax:       0.93 m/s            SHUNTS MV Vmean:      61.4 cm/s           Systemic VTI:  0.07 m MV VTI:        0.07 m              Systemic Diam: 1.90 cm MV PHT:        31.71 msec MV Decel Time: 109 msec MV E velocity: 81.33 cm/s 103 cm/s  Debbe Odea MD Electronically signed by Debbe Odea MD Signature Date/Time: 05/08/2019/11:11:18 AM    Final    ASSESSMENT AND PLAN:  Tyler [name] is a 49 y.o. male with significant past history of drug abuse, smoking for 30+ years comes to the ED today with recurrent fall experiencing recently.  Patient stated over the past couple of weeks he would feel short of breath suddenly and palpitation.   1) Atrial flutter--Rapid -Patient is currently on Cardizem drip, po digoxin and metoprolol per [facility] cardiology -HR was  in the 150's -- spontaneously converted to sinus rhythm heart rate in the 70s. Wean off Cardizem drip -Patient's CHA2DS2-VASc score of 2 -IV heparin drip --- change to oral  anticoagulation tomorrow after discussing with cardiology  - However CT head shows possible left orbital floor fracture and also small hyperdense air-fluid level of the left maxillary sinus suspicious for hemorrhage.  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 life vest -pt now on Toprol XL and losartan per Dr Graciela Husbands  2) syncope with collapse and blackeye left with orbital base fracture and suspected mild hemorrhage in left maxillary sinus  -this is likely due to rapid a flutter fib -ENT consultation appreciated  3)  polysubstance drug abuse: -Patient admits using methamphetamine on a regular basis.  He also admits using marijuana occasionally. drinks alcohol very seldomly. -As needed antihypertensives -Counseled on cessation -no signs of withdrawal  4) AKI: Creatinine found to be 1.29 today even though it was normal from 2014  -received IV fluids-- creatinine .9 -DC IV fluids -Monitor renal function -Avoid potential nephrotoxic's and contrast materials  5) smoking: Admits to smoking 30+ years of regular cigarettes -Counseled on cessation    DVT Prophylaxis: Lovenox Code Status: Full code Family Communication: None at bedside Disposition Plan: Home when stable Consults: cardiology, ENT  TOTAL TIME TAKING CARE OF THIS PATIENT: **25* minutes.  >50% time spent on counselling and coordination of care  POSSIBLE D/C IN *?** DAYS, DEPENDING ON CLINICAL CONDITION.  Note: This dictation was prepared with Dragon dictation along with smaller phrase technology. Any transcriptional errors that result from this process are unintentional.  Enedina Finner M.D on 05/09/2019 at 1:34 PM  Between 7am to 6pm - Pager - [phone]  After 6pm go to [email or web address]  [facility]   CC: Primary care physician; Patient, No  Pcp PerPatient ID: Tyler [name], male   DOB: 1970/11/15, 49 y.o.   MRN: [id number]

## 2019-05-09 NOTE — Progress Notes (Addendum)
ANTICOAGULATION CONSULT NOTE - Follow up Consult  Pharmacy Consult for Heparin  Indication: atrial fibrillation  No Known Allergies  Patient Measurements: Height: 5\' 8"  (172.7 cm) Weight: 164 lb 10.9 oz (74.7 kg) IBW/kg (Calculated) : 68.4 Heparin Dosing Weight: 77.1 kg >74.7 kg  Vital Signs: Temp: 98.5 F (36.9 C) (01/02 0649) Temp Source: Oral (01/02 0649) BP: 125/95 (01/02 0900) Pulse Rate: 76 (01/02 0900)  Labs: Recent Labs    05/07/19 1219 05/07/19 1730 05/08/19 0538 05/08/19 1527 05/08/19 2213 05/09/19 0540 05/09/19 1309  HGB 15.2 15.4 15.0  --   --  16.5  --   HCT 46.6 44.8 43.6  --   --  49.7  --   PLT 242 268 265  --   --  238  --   APTT  --   --   --  28  --   --   --   LABPROT  --   --  12.6  --   --   --   --   INR  --   --  1.0  --   --   --   --   HEPARINUNFRC  --   --   --   --  0.14* 0.19* 0.16*  CREATININE 1.29*  --  0.91  --   --   --   --   TROPONINIHS 87*  --   --   --   --   --   --     Estimated Creatinine Clearance: 98.1 mL/min (by C-G formula based on SCr of 0.91 mg/dL).   Medical History: Past Medical History:  Diagnosis Date  . Hypertension      Assessment: 49 yo male to start heparin drip for atrial flutter with RVR. No PTA meds. Per heparin consult, NO bolus.   Goal of Therapy:  Heparin level 0.3-0.7 units/ml Monitor platelets by anticoagulation protocol: Yes   Plan:  01/02 @ 1309 HL 0.16 subtherapeutic. RN confirms heparin running at 14 ml/hr; no line issues or s/sx of bleeding noted. Will increase rate to 1600 units/hr w/o bolus and recheck HL @ 2100, CBC stable will continue to monitor.  Pharmacy will continue to follow.   03/02, PharmD, BCPS Clinical Pharmacist 05/09/2019 1:53 PM

## 2019-05-09 NOTE — Progress Notes (Addendum)
Progress Note  Patient Name: Tyler [name] Date of Encounter: 05/09/2019  Primary Cardiologist: [facility] rounding  Patient Profile     49 y.o. male with history of hypertension, meth use, 30+ year smoker, presenting with shortness of breath and tachycardia after a syncopal episode.  Found to be in atrial flutter with difficult to control heart rates.  CHA2DS2-VASc score equals 2 (CHF, htn).  Echocardiogram today revealed severely reduced EF. Has hx of recurrent similar tachypalps x months assoc with profound LH and dyspnea, with some chest pain 1/2 spontaneous reversion   anticoagulation with Heparin per ENT 2/2 ? Sinus bleeding    Subjective   No cp or sob,  Awakened from sleep and now in sinus with out palps  Inpatient Medications    Scheduled Meds: . Chlorhexidine Gluconate Cloth  6 each Topical Daily  . folic acid  1 mg Oral Daily  . [START ON 05/10/2019] influenza vac split quadrivalent PF  0.5 mL Intramuscular Tomorrow-1000  . metoprolol tartrate  25 mg Oral Q6H  . multivitamin with minerals  1 tablet Oral Daily  . potassium chloride  20 mEq Oral BID  . sodium chloride flush  3 mL Intravenous Q12H  . thiamine  100 mg Oral Daily   Or  . thiamine  100 mg Intravenous Daily   Continuous Infusions: . diltiazem (CARDIZEM) infusion 15 mg/hr (05/09/19 1059)  . heparin 1,400 Units/hr (05/09/19 0639)   PRN Meds: acetaminophen, ALPRAZolam, HYDROcodone-acetaminophen, ipratropium-albuterol, labetalol, LORazepam **OR** LORazepam, ondansetron (ZOFRAN) IV   Vital Signs    Vitals:   05/09/19 0649 05/09/19 0700 05/09/19 0800 05/09/19 0900  BP: (!) 119/93 (!) 142/90 105/85 (!) 125/95  Pulse: (!) 121 (!) 49 73 76  Resp:  (!) 22 (!) 21 (!) 24  Temp: 98.5 F (36.9 C)     TempSrc: Oral     SpO2:  99% 98% 98%  Weight:      Height:        Intake/Output Summary (Last 24 hours) at 05/09/2019 1222 Last data filed at 05/09/2019 0818 Gross per 24 hour  Intake 1521.47 ml  Output  1500 ml  Net 21.47 ml   Last 3 Weights 05/09/2019 05/07/2019 04/18/2016  Weight (lbs) 164 lb 10.9 oz 170 lb 165 lb  Weight (kg) 74.7 kg 77.111 kg 74.844 kg      Telemetry    Atrial flutter heart rate 146- Personally Reviewed>> sinus   ECG     Physical Exam   Well developed and nourished in no acute distress HENT normal x bruised L orbit Neck supple  Clear Regular rate and rhythm, no murmurs or gallops Abd-soft with active BS No Clubbing cyanosis edema Skin-warm and dry A & Oriented  Grossly normal sensory and motor function     Labs    High Sensitivity Troponin:   Recent Labs  Lab 05/07/19 1219  TROPONINIHS 87*      Chemistry Recent Labs  Lab 05/07/19 1219 05/08/19 0538  NA 141 140  K 3.7 4.3  CL 107 105  CO2 25 25  GLUCOSE 116* 108*  BUN 15 12  CREATININE 1.29* 0.91  CALCIUM 8.6* 8.9  PROT 6.7  --   ALBUMIN 4.0  --   AST 48*  --   ALT 43  --   ALKPHOS 58  --   BILITOT 0.7  --   GFRNONAA >60 >60  GFRAA >60 >60  ANIONGAP 9 10     Hematology Recent Labs  Lab 05/07/19  1730 05/08/19 0538 05/09/19 0540  WBC 12.2* 10.6* 12.6*  RBC 5.05 4.88 5.44  HGB 15.4 15.0 16.5  HCT 44.8 43.6 49.7  MCV 88.7 89.3 91.4  MCH 30.5 30.7 30.3  MCHC 34.4 34.4 33.2  RDW 12.9 13.1 13.1  PLT 268 265 238    BNPNo results for input(s): BNP, PROBNP in the last 168 hours.   DDimer No results for input(s): DDIMER in the last 168 hours.   Radiology    CT Head Wo Contrast  Result Date: 05/07/2019 CLINICAL DATA:  Fall, left eye bruising and swelling EXAM: CT HEAD WITHOUT CONTRAST TECHNIQUE: Contiguous axial images were obtained from the base of the skull through the vertex without intravenous contrast. COMPARISON:  CT facial bones, 03/08/2013 FINDINGS: Brain: No evidence of acute infarction, hemorrhage, hydrocephalus, extra-axial collection or mass lesion/mass effect. Vascular: No hyperdense vessel or unexpected calcification. Skull: Possible minimally displaced acute  on chronic fractures of the left nasal bones (series 2, image 7). Negative for fracture or focal lesion. Sinuses/Orbits: There is a small, hyperdense air-fluid level of the left maxillary sinus. There is irregularity of the left orbital floor, not optimally evaluated on this non tailored examination. There is a nonacute left orbital floor fracture better assessed on prior dedicated CT examination of the facial bones. Nonacute fracture of the right lamina papyracea. Other: Soft tissue contusion about the left forehead, nose, left orbit, and left cheek. IMPRESSION: 1. No acute intracranial pathology 2. Possible minimally displaced acute on chronic fractures of the left nasal bones. 3. There is a small, hyperdense air-fluid level of the left maxillary sinus suspicious for hemorrhage. There is irregularity of the left orbital floor, not optimally evaluated on this non tailored examination. There is a nonacute left orbital floor fracture better assessed on prior dedicated CT examination of the facial bones. Recommend dedicated CT examination of the facial bones to better assess for acutely superimposed recurrent facial bone or orbital fracture. 4. Soft tissue contusion about the left forehead, nose, left orbit, and left cheek. Electronically Signed   By: Lauralyn Primes M.D.   On: 05/07/2019 16:29   DG Chest Port 1 View  Result Date: 05/08/2019 CLINICAL DATA:  SOB. Patient admitted after LOC, tachycardia, atrial flutter. Current smoker, methamphetamine user. EXAM: PORTABLE CHEST 1 VIEW COMPARISON:  None. FINDINGS: The heart size and mediastinal contours are within normal limits. There is coarsening of the interstitium bilaterally likely representing chronic bronchitic change. No focal infiltrate. No pneumothorax or large pleural effusion. Subacute to chronic right lateral rib fracture. IMPRESSION: 1. No acute cardiopulmonary process. 2. Chronic bronchitic change. Electronically Signed   By: Emmaline Kluver M.D.   On:  05/08/2019 15:35   ECHOCARDIOGRAM COMPLETE  Result Date: 05/08/2019   ECHOCARDIOGRAM REPORT   Patient Name:   Tyler [name] Date of Exam: 05/08/2019 Medical Rec #:  [id number]     Height:       68.0 in Accession #:    [id number]    Weight:       170.0 lb Date of Birth:  [birth year]     BSA:          1.91 m Patient Age:    48 years      BP:           110/95 mmHg Patient Gender: M             HR:           146 bpm. Exam Location:  [facility] Procedure:  2D Echo, Color Doppler, Cardiac Doppler and Intracardiac            Opacification Agent Indications:     I48.92 Atrial Flutter  History:         Patient has no prior history of Echocardiogram examinations.                  Risk Factors:Hypertension.  Sonographer:     Humphrey Rolls RDCS (AE) Referring Phys:  [id number] Debbe Odea Diagnosing Phys: Debbe Odea MD  Sonographer Comments: TDS due to tachycardia. IMPRESSIONS  1. Left ventricular ejection fraction, by visual estimation, is 25 to 30%. The left ventricle has severely decreased function. There is no left ventricular hypertrophy.  2. Definity contrast agent was given IV to delineate the left ventricular endocardial borders.  3. Indeterminate diastolic filling due to E-A fusion.  4. The left ventricle demonstrates global hypokinesis.  5. Global right ventricle has severely reduced systolic function.The right ventricular size is normal. Right vetricular wall thickness was not assessed.  6. Left atrial size was normal.  7. Right atrial size was normal.  8. The mitral valve is normal in structure. Trivial mitral valve regurgitation.  9. The tricuspid valve is normal in structure. 10. The aortic valve is normal in structure. Aortic valve regurgitation is not visualized. 11. The pulmonic valve was normal in structure. Pulmonic valve regurgitation is not visualized. 12. Normal pulmonary artery systolic pressure. 13. The inferior vena cava is normal in size with <50% respiratory variability, suggesting right atrial  pressure of 8 mmHg. FINDINGS  Left Ventricle: Left ventricular ejection fraction, by visual estimation, is 25 to 30%. The left ventricle has severely decreased function. Definity contrast agent was given IV to delineate the left ventricular endocardial borders. The left ventricle demonstrates global hypokinesis. The left ventricular internal cavity size was the left ventricle is normal in size. There is no left ventricular hypertrophy. Indeterminate diastolic filling due to E-A fusion. Right Ventricle: The right ventricular size is normal. Right vetricular wall thickness was not assessed. Global RV systolic function is has severely reduced systolic function. The tricuspid regurgitant velocity is 2.12 m/s, and with an assumed right atrial pressure of 8 mmHg, the estimated right ventricular systolic pressure is normal at 26.0 mmHg. Left Atrium: Left atrial size was normal in size. Right Atrium: Right atrial size was normal in size Pericardium: There is no evidence of pericardial effusion. Mitral Valve: The mitral valve is normal in structure. Trivial mitral valve regurgitation. MV peak gradient, 3.5 mmHg. Tricuspid Valve: The tricuspid valve is normal in structure. Tricuspid valve regurgitation is not demonstrated. Aortic Valve: The aortic valve is normal in structure. Aortic valve regurgitation is not visualized. Aortic valve mean gradient measures 1.0 mmHg. Aortic valve peak gradient measures 1.0 mmHg. Aortic valve area, by VTI measures 3.10 cm. Pulmonic Valve: The pulmonic valve was normal in structure. Pulmonic valve regurgitation is not visualized. Pulmonic regurgitation is not visualized. Aorta: The aortic root is normal in size and structure. Venous: The inferior vena cava is normal in size with less than 50% respiratory variability, suggesting right atrial pressure of 8 mmHg. IAS/Shunts: No atrial level shunt detected by color flow Doppler.  LEFT VENTRICLE PLAX 2D LVIDd:         4.76 cm  Diastology LVIDs:          3.89 cm  LV e' lateral:   12.50 cm/s LV PW:         0.99 cm  LV E/e' lateral: 6.5  LV IVS:        0.79 cm  LV e' medial:    6.85 cm/s LVOT diam:     1.90 cm  LV E/e' medial:  11.9 LV SV:         40 ml LV SV Index:   20.63 LVOT Area:     2.84 cm  RIGHT VENTRICLE RV Basal diam:  3.23 cm LEFT ATRIUM             Index       RIGHT ATRIUM           Index LA diam:        3.30 cm 1.73 cm/m  RA Area:     16.70 cm LA Vol (A2C):   50.2 ml 26.32 ml/m RA Volume:   41.90 ml  21.97 ml/m LA Vol (A4C):   20.9 ml 10.96 ml/m LA Biplane Vol: 34.2 ml 17.93 ml/m  AORTIC VALVE                   PULMONIC VALVE AV Area (Vmax):    3.00 cm    PV Vmax:       0.49 m/s AV Area (Vmean):   2.74 cm    PV Vmean:      39.400 cm/s AV Area (VTI):     3.10 cm    PV VTI:        0.078 m AV Vmax:           50.30 cm/s  PV Peak grad:  1.0 mmHg AV Vmean:          36.000 cm/s PV Mean grad:  1.0 mmHg AV VTI:            0.060 m AV Peak Grad:      1.0 mmHg AV Mean Grad:      1.0 mmHg LVOT Vmax:         53.20 cm/s LVOT Vmean:        34.800 cm/s LVOT VTI:          0.066 m LVOT/AV VTI ratio: 1.09  AORTA Ao Root diam: 3.10 cm MITRAL VALVE                       TRICUSPID VALVE MV Area (PHT): 6.94 cm            TR Peak grad:   18.0 mmHg MV Peak grad:  3.5 mmHg            TR Vmax:        212.00 cm/s MV Mean grad:  2.0 mmHg MV Vmax:       0.93 m/s            SHUNTS MV Vmean:      61.4 cm/s           Systemic VTI:  0.07 m MV VTI:        0.07 m              Systemic Diam: 1.90 cm MV PHT:        31.71 msec MV Decel Time: 109 msec MV E velocity: 81.33 cm/s 103 cm/s  Debbe Odea MD Electronically signed by Debbe Odea MD Signature Date/Time: 05/08/2019/11:11:18 AM    Final     Cardiac Studies   TTE 05/07/2018 1. Left ventricular ejection fraction, by visual estimation, is 25 to 30%. The left ventricle has severely decreased function. There is no left ventricular hypertrophy.  2. Definity contrast agent was given IV to delineate the left  ventricular endocardial borders.  3. Indeterminate diastolic filling due to E-A fusion.  4. The left ventricle demonstrates global hypokinesis.  5. Global right ventricle has severely reduced systolic function.The right ventricular size is normal. Right vetricular wall thickness was not assessed.  6. Left atrial size was normal.  7. Right atrial size was normal.  8. The mitral valve is normal in structure. Trivial mitral valve regurgitation.  9. The tricuspid valve is normal in structure. 10. The aortic valve is normal in structure. Aortic valve regurgitation is not visualized. 11. The pulmonic valve was normal in structure. Pulmonic valve regurgitation is not visualized. 12. Normal pulmonary artery systolic pressure. 13. The inferior vena cava is normal in size with <50% respiratory variability, suggesting right atrial pressure of 8 mmHg   Assessment & Plan    Echocardiogram shows severely reduced ejection fraction with EF 25 to 30%.  Etiology for reduced ejection fraction is likely meth use.  He is euvolemic on my exam.  However, formal ischemic work-up (Lexi or left heart cath) will have to be performed after heart rates are adequately controlled.  Patient still tachycardic with heart rate in the 140s on Cardizem drip.  His head CT showed hyperdense fluid level in the left maxillary sinus suspicious for hemorrhage.  Recommend ENT input regarding safety of anticoagulation.  Patient will need to be anticoagulated if TEE and cardioversion is to be performed, hopefully on Monday.  1.  Atrial flutter, typical counterclockwise   2.  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  Syncope    The pt has reverted to sinus rhythm, simplifying life.  Will plan further discussion tomorrow about RFCA as primary therapy in about a month, so as can be done off anticoagulation and hopefully with interval improvement in LV function, with any assoc benefit in reduced risk of procedure  The  cause of his syncope is unknown, and in the setting of LV dysfunction can be a high risk marker of sudden death..  Given the pts history that these palpitations have been recurrent and assoc with LH offers the hypothesis that his Aflutter was the triggers; however can not exclude VT.  Would be inclined, if pt agreeable, to discharge with LifeVest as we wait to see if the EF improves with rate control.  The conjecture that LV dysfunction is 2/2 meth is simply that.  Would begin him on oral anticoagulation when ok with ENT with discharge thereafter  Will change meto tartrate to succinate for LV dysfunction and add low dose losartan for afterload reduction and stop dilt now that in sinus      Signed, Virl Axe, MD  05/09/2019, 12:22 PM

## 2019-05-09 NOTE — Progress Notes (Signed)
ANTICOAGULATION CONSULT NOTE - Follow up Consult  Pharmacy Consult for Heparin  Indication: atrial fibrillation  No Known Allergies  Patient Measurements: Height: 5\' 8"  (172.7 cm) Weight: 164 lb 10.9 oz (74.7 kg) IBW/kg (Calculated) : 68.4 Heparin Dosing Weight: 77.1 kg >74.7 kg  Vital Signs: Temp: 98.3 F (36.8 C) (01/02 2000) Temp Source: Oral (01/02 2000) BP: 135/86 (01/02 2028) Pulse Rate: 79 (01/02 2028)  Labs: Recent Labs    05/07/19 1219 05/07/19 1730 05/07/19 1730 05/08/19 0538 05/08/19 1527 05/09/19 0540 05/09/19 1309 05/09/19 2042  HGB 15.2 15.4  --  15.0  --  16.5  --   --   HCT 46.6 44.8  --  43.6  --  49.7  --   --   PLT 242 268  --  265  --  238  --   --   APTT  --   --   --   --  28  --   --   --   LABPROT  --   --   --  12.6  --   --   --   --   INR  --   --   --  1.0  --   --   --   --   HEPARINUNFRC  --   --    < >  --   --  0.19* 0.16* 0.34  CREATININE 1.29*  --   --  0.91  --   --   --   --   TROPONINIHS 87*  --   --   --   --   --   --   --    < > = values in this interval not displayed.    Estimated Creatinine Clearance: 98.1 mL/min (by C-G formula based on SCr of 0.91 mg/dL).   Medical History: Past Medical History:  Diagnosis Date  . Hypertension      Assessment: 49 yo male to start heparin drip for atrial flutter with RVR. No PTA meds. Per heparin consult, NO bolus.   Goal of Therapy:  Heparin level 0.3-0.7 units/ml Monitor platelets by anticoagulation protocol: Yes   Plan:  01/02 @ 1309 HL 0.16 subtherapeutic. RN confirms heparin running at 14 ml/hr; no line issues or s/sx of bleeding noted. Will increase rate to 1600 units/hr w/o bolus and recheck HL @ 2100, CBC stable will continue to monitor.  01/02 @ 2042:  HL = 0.34 Will continue this pt on current rate and draw confirmation level on 1/3 @ 0300.   Pharmacy will continue to follow.   Frenchie Dangerfield D Clinical Pharmacist 05/09/2019 9:30 PM

## 2019-05-09 NOTE — Progress Notes (Signed)
Patient's HR elevated to 155 while at rest. Cardizem increased was infusing at 10 during that time and was titrated to 15 with no improvements in HR. Cardiology phoned, spoke to Dr. Johney Frame, new orders received. Orders noted and carried out. Patient's HR now 118-140 in afib.

## 2019-05-09 NOTE — Progress Notes (Signed)
ANTICOAGULATION CONSULT NOTE - Initial Consult  Pharmacy Consult for Heparin  Indication: atrial fibrillation  No Known Allergies  Patient Measurements: Height: 5\' 8"  (172.7 cm) Weight: 164 lb 10.9 oz (74.7 kg) IBW/kg (Calculated) : 68.4 Heparin Dosing Weight: 77.1 kg  Vital Signs: Temp: 98.2 F (36.8 C) (01/02 0400) Temp Source: Axillary (01/02 0400) BP: 141/101 (01/02 0606) Pulse Rate: 155 (01/02 0634)  Labs: Recent Labs    05/07/19 1219 05/07/19 1730 05/08/19 0538 05/08/19 1527 05/08/19 2213 05/09/19 0540  HGB 15.2 15.4 15.0  --   --  16.5  HCT 46.6 44.8 43.6  --   --  49.7  PLT 242 268 265  --   --  238  APTT  --   --   --  28  --   --   LABPROT  --   --  12.6  --   --   --   INR  --   --  1.0  --   --   --   HEPARINUNFRC  --   --   --   --  0.14* 0.19*  CREATININE 1.29*  --  0.91  --   --   --   TROPONINIHS 87*  --   --   --   --   --     Estimated Creatinine Clearance: 98.1 mL/min (by C-G formula based on SCr of 0.91 mg/dL).   Medical History: Past Medical History:  Diagnosis Date  . Hypertension      Assessment: 49 yo male to start heparin drip for atrial flutter with RVR. No PTA meds. Per heparin consult, NO bolus.   Goal of Therapy:  Heparin level 0.3-0.7 units/ml Monitor platelets by anticoagulation protocol: Yes   Plan:  01/02 @ 0500 HL 0.19 subtherapeutic. Will increase rate to 1400 units/hr w/o bolus and recheck HL @ 1300, CBC stable will continue to monitor.  03/02, PharmD, BCPS Clinical Pharmacist 05/09/2019,6:37 AM

## 2019-05-10 LAB — CBC
HCT: 44.1 % (ref 39.0–52.0)
Hemoglobin: 15.3 g/dL (ref 13.0–17.0)
MCH: 30.5 pg (ref 26.0–34.0)
MCHC: 34.7 g/dL (ref 30.0–36.0)
MCV: 87.8 fL (ref 80.0–100.0)
Platelets: 203 10*3/uL (ref 150–400)
RBC: 5.02 MIL/uL (ref 4.22–5.81)
RDW: 12.9 % (ref 11.5–15.5)
WBC: 8.6 10*3/uL (ref 4.0–10.5)
nRBC: 0 % (ref 0.0–0.2)

## 2019-05-10 LAB — HEPARIN LEVEL (UNFRACTIONATED): Heparin Unfractionated: 0.38 IU/mL (ref 0.30–0.70)

## 2019-05-10 MED ORDER — APIXABAN 5 MG PO TABS
5.0000 mg | ORAL_TABLET | Freq: Two times a day (BID) | ORAL | Status: DC
  Administered 2019-05-10 – 2019-05-11 (×3): 5 mg via ORAL
  Filled 2019-05-10 (×3): qty 1

## 2019-05-10 MED ORDER — LOSARTAN POTASSIUM 50 MG PO TABS
25.0000 mg | ORAL_TABLET | Freq: Two times a day (BID) | ORAL | Status: DC
  Administered 2019-05-10 – 2019-05-11 (×2): 25 mg via ORAL
  Filled 2019-05-10 (×2): qty 1

## 2019-05-10 MED ORDER — METOPROLOL SUCCINATE ER 50 MG PO TB24
50.0000 mg | ORAL_TABLET | Freq: Two times a day (BID) | ORAL | Status: DC
  Administered 2019-05-10 – 2019-05-11 (×3): 50 mg via ORAL
  Filled 2019-05-10 (×3): qty 1

## 2019-05-10 NOTE — Progress Notes (Signed)
ANTICOAGULATION CONSULT NOTE - Follow up Consult  Pharmacy Consult for Heparin  Indication: atrial fibrillation  No Known Allergies  Patient Measurements: Height: 5\' 8"  (172.7 cm) Weight: 164 lb 10.9 oz (74.7 kg) IBW/kg (Calculated) : 68.4 Heparin Dosing Weight: 77.1 kg >74.7 kg  Vital Signs: Temp: 99.1 F (37.3 C) (01/03 0300) Temp Source: Oral (01/03 0300) BP: 117/89 (01/03 0300) Pulse Rate: 75 (01/03 0300)  Labs: Recent Labs    05/07/19 1219 05/07/19 1730 05/08/19 0538 05/08/19 1527 05/09/19 0540 05/09/19 1309 05/09/19 2042 05/10/19 0302  HGB 15.2  --  15.0  --  16.5  --   --  15.3  HCT 46.6  --  43.6  --  49.7  --   --  44.1  PLT 242  --  265  --  238  --   --  203  APTT  --   --   --  28  --   --   --   --   LABPROT  --   --  12.6  --   --   --   --   --   INR  --   --  1.0  --   --   --   --   --   HEPARINUNFRC  --    < >  --   --  0.19* 0.16* 0.34 0.38  CREATININE 1.29*  --  0.91  --   --   --   --   --   TROPONINIHS 87*  --   --   --   --   --   --   --    < > = values in this interval not displayed.    Estimated Creatinine Clearance: 98.1 mL/min (by C-G formula based on SCr of 0.91 mg/dL).   Medical History: Past Medical History:  Diagnosis Date  . Hypertension      Assessment: 49 yo male to start heparin drip for atrial flutter with RVR. No PTA meds. Per heparin consult, NO bolus.   Goal of Therapy:  Heparin level 0.3-0.7 units/ml Monitor platelets by anticoagulation protocol: Yes   Plan:  01/03 @ 0300 HL 0.38 therapeutic. Will continue current rate and will recheck HL w/ am labs, CBC stable will continue to monitor.  Pharmacy will continue to follow.   03/03, PharmD, BCPS Clinical Pharmacist 05/10/2019 3:49 AM

## 2019-05-10 NOTE — Progress Notes (Signed)
[facility]  - [place] at [facility]   PATIENT NAME: Tyler [name]    MR#:  [id number]  DATE OF BIRTH:  12-04-1970  SUBJECTIVE:  patient came in after he had a syncopal episode and collapse. He has black eye. Found to be in atrial flutter Denies any chest pain. Patient now converted to sinus rhythm heart rate 80-90 REVIEW OF SYSTEMS:   Review of Systems  Constitutional: Negative for chills, fever and weight loss.  HENT: Negative for ear discharge, ear pain and nosebleeds.   Eyes: Negative for blurred vision, pain and discharge.  Respiratory: Negative for sputum production, shortness of breath, wheezing and stridor.   Cardiovascular: Positive for palpitations. Negative for chest pain, orthopnea and PND.  Gastrointestinal: Negative for abdominal pain, diarrhea, nausea and vomiting.  Genitourinary: Negative for frequency and urgency.  Musculoskeletal: Negative for back pain and joint pain.  Neurological: Positive for weakness. Negative for sensory change, speech change and focal weakness.  Psychiatric/Behavioral: Negative for depression and hallucinations. The patient is not nervous/anxious.    Tolerating Diet:yes Tolerating PT: not needed  DRUG ALLERGIES:  No Known Allergies  VITALS:  Blood pressure 114/73, pulse 84, temperature 98.6 F (37 C), temperature source Oral, resp. rate (!) 23, height 5\' 8"  (1.727 m), weight 74.7 kg, SpO2 95 %.  PHYSICAL EXAMINATION:   Physical Exam  GENERAL:  49 y.o.-year-old patient lying in the bed with no acute distress.  EYES: Pupils equal, round, reactive to light and accommodation. Left black eye with swelling HEENT: Head atraumatic, normocephalic. Oropharynx and nasopharynx clear.  NECK:  Supple, no jugular venous distention. No thyroid enlargement, no tenderness.  LUNGS: Normal breath sounds bilaterally, no wheezing, rales, rhonchi. No use of accessory muscles of respiration.  CARDIOVASCULAR: S1, S2 normal. No murmurs, rubs,  or gallops. Tachycardia ABDOMEN: Soft, nontender, nondistended. Bowel sounds present. No organomegaly or mass.  EXTREMITIES: No cyanosis, clubbing or edema b/l.    NEUROLOGIC: Cranial nerves II through XII are intact. No focal Motor or sensory deficits b/l.   PSYCHIATRIC:  patient is alert and oriented x 3.  SKIN: No obvious rash, lesion, or ulcer.   LABORATORY PANEL:  CBC Recent Labs  Lab 05/10/19 0302  WBC 8.6  HGB 15.3  HCT 44.1  PLT 203    Chemistries  Recent Labs  Lab 05/07/19 1219 05/07/19 1220 05/08/19 0538  NA 141  --  140  K 3.7  --  4.3  CL 107  --  105  CO2 25  --  25  GLUCOSE 116*  --  108*  BUN 15  --  12  CREATININE 1.29*  --  0.91  CALCIUM 8.6*  --  8.9  MG  --  2.0  --   AST 48*  --   --   ALT 43  --   --   ALKPHOS 58  --   --   BILITOT 0.7  --   --    Cardiac Enzymes No results for input(s): TROPONINI in the last 168 hours. RADIOLOGY:  DG Chest Port 1 View  Result Date: 05/08/2019 CLINICAL DATA:  SOB. Patient admitted after LOC, tachycardia, atrial flutter. Current smoker, methamphetamine user. EXAM: PORTABLE CHEST 1 VIEW COMPARISON:  None. FINDINGS: The heart size and mediastinal contours are within normal limits. There is coarsening of the interstitium bilaterally likely representing chronic bronchitic change. No focal infiltrate. No pneumothorax or large pleural effusion. Subacute to chronic right lateral rib fracture. IMPRESSION: 1. No acute cardiopulmonary process.  2. Chronic bronchitic change. Electronically Signed   By: Tyler [name] M.D.   On: 05/08/2019 15:35   ASSESSMENT AND PLAN:  Tyler [name] is a 49 y.o. male with significant past history of drug abuse, smoking for 30+ years comes to the ED today with recurrent fall experiencing recently.  Patient stated over the past couple of weeks he would feel short of breath suddenly and palpitation.   1) Atrial flutter--Rapid -Patient is OFF  Cardizem drip -PO eliquis. 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  He also admits using marijuana occasionally. drinks alcohol very seldomly. -As needed antihypertensives -Counseled on cessation -no signs of withdrawal  4) AKI: Creatinine found to be 1.29 today even though it was normal from 2014  -received IV fluids-- creatinine .9 -DC IV fluids -Monitor renal function -Avoid potential nephrotoxic's and contrast materials  5) smoking: Admits to smoking 30+ years of regular cigarettes -Counseled on cessation  Anticipate discharge tomorrow after life vest has been obtained. Discussed with Dr. Adam Phenix    DVT Prophylaxis: eliquis Code Status: Full code Family Communication: None at bedside Disposition Plan: Home when stable Consults: cardiology, ENT  TOTAL TIME TAKING CARE OF THIS PATIENT: **25* minutes.  >50% time spent on counselling and coordination of care  POSSIBLE D/C IN *?** DAYS, DEPENDING ON CLINICAL CONDITION.  Note: This dictation was prepared with Dragon dictation along with smaller phrase technology. Any transcriptional errors that result from this process are unintentional.  [name] M.D on 05/10/2019 at 2:27 PM  Between 7am to 6pm - Pager - [phone]  After 6pm go to [email or web address]  [facility]   CC: Primary care physician; Patient, No Pcp PerPatient ID: Tyler [name], male   DOB: 03-29-1971, 49 y.o.   MRN: [id number]

## 2019-05-10 NOTE — Progress Notes (Signed)
ANTICOAGULATION CONSULT NOTE - Initial Consult  Pharmacy Consult for Apixaban Indication: A flutter  No Known Allergies  Patient Measurements: Height: 5\' 8"  (172.7 cm) Weight: 164 lb 10.9 oz (74.7 kg) IBW/kg (Calculated) : 68.4  Vital Signs: Temp: 98.6 F (37 C) (01/03 0800) Temp Source: Oral (01/03 0800) BP: 114/73 (01/03 1000) Pulse Rate: 84 (01/03 1000)  Labs: Recent Labs    05/08/19 0538 05/08/19 1527 05/09/19 0540 05/09/19 1309 05/09/19 2042 05/10/19 0302  HGB 15.0  --  16.5  --   --  15.3  HCT 43.6  --  49.7  --   --  44.1  PLT 265  --  238  --   --  203  APTT  --  28  --   --   --   --   LABPROT 12.6  --   --   --   --   --   INR 1.0  --   --   --   --   --   HEPARINUNFRC  --   --  0.19* 0.16* 0.34 0.38  CREATININE 0.91  --   --   --   --   --     Estimated Creatinine Clearance: 98.1 mL/min (by C-G formula based on SCr of 0.91 mg/dL).   Medical History: Past Medical History:  Diagnosis Date  . Hypertension      Assessment: 49 yo male transitioning from heparin drip to apixaban for A flutter.  Goal of Therapy:  Monitor platelets by anticoagulation protocol: Yes   Plan:  Apixaban 5 mg PO BID Informed RN to give first dose when heparin drip is turned off SCr in AM - SCr and CBC at least every three days   Pharmacy will continue to follow   49 L 05/10/2019,2:48 PM

## 2019-05-10 NOTE — Progress Notes (Signed)
CCMD and E-LINK RN called stating patient has an elevated ST wave.  Dr. Enedina Finner and Dr. Graciela Husbands made aware.  No further orders given.  Patient resting comfortably in bed.  No complaints of chest pain or heaviness at this time.  VSS.  Will continue to monitor.

## 2019-05-10 NOTE — Progress Notes (Addendum)
Progress Note  Patient Name: Tyler [name] Date of Encounter: 05/10/2019  Primary Cardiologist: [place] rounding  Patient Profile     49 y.o. male with history of hypertension, meth use, 30+ year smoker, presenting with shortness of breath and tachycardia after a syncopal episode.  Found to be in atrial flutter with difficult to control heart rates.  CHA2DS2-VASc score equals 2 (CHF, htn).  Echocardiogram today revealed severely reduced EF. Has hx of recurrent similar tachypalps x months assoc with profound LH and dyspnea, with some chest pain 1/2 spontaneous reversion   anticoagulation with Heparin per ENT 2/2 ? Sinus bleeding    Subjective  No chest pain or sob  Inpatient Medications    Scheduled Meds: . Chlorhexidine Gluconate Cloth  6 each Topical Daily  . folic acid  1 mg Oral Daily  . influenza vac split quadrivalent PF  0.5 mL Intramuscular Tomorrow-1000  . losartan  25 mg Oral Daily  . metoprolol succinate  50 mg Oral Daily  . multivitamin with minerals  1 tablet Oral Daily  . potassium chloride  20 mEq Oral BID  . sodium chloride flush  3 mL Intravenous Q12H  . thiamine  100 mg Oral Daily   Or  . thiamine  100 mg Intravenous Daily   Continuous Infusions: . heparin 1,600 Units/hr (05/10/19 0800)   PRN Meds: acetaminophen, ALPRAZolam, HYDROcodone-acetaminophen, ipratropium-albuterol, ondansetron (ZOFRAN) IV   Vital Signs    Vitals:   05/10/19 0300 05/10/19 0700 05/10/19 0800 05/10/19 0900  BP: 117/89 (!) 132/91 113/72 108/90  Pulse: 75 75 89 81  Resp: 19 20 (!) 22 17  Temp: 99.1 F (37.3 C)  98.6 F (37 C)   TempSrc: Oral  Oral   SpO2: 95% 100% 93% 93%  Weight:      Height:        Intake/Output Summary (Last 24 hours) at 05/10/2019 0941 Last data filed at 05/10/2019 0800 Gross per 24 hour  Intake 159.11 ml  Output 1375 ml  Net -1215.89 ml   Last 3 Weights 05/09/2019 05/07/2019 04/18/2016  Weight (lbs) 164 lb 10.9 oz 170 lb 165 lb  Weight (kg)  74.7 kg 77.111 kg 74.844 kg      Telemetry    Personally reviewed  sinus   ECG     Physical Exam   Well developed and nourished in no acute distress HENT normal Neck supple with JVP-  Flat  Clear Regular rate and rhythm, no murmurs or gallops Abd-soft with active BS No Clubbing cyanosis edema Skin-warm and dry A & Oriented  Grossly normal sensory and motor function  ECG difficult to interpret because of leads out of order and possibly aVR being made negative     Labs    High Sensitivity Troponin:   Recent Labs  Lab 05/07/19 1219  TROPONINIHS 87*      Chemistry Recent Labs  Lab 05/07/19 1219 05/08/19 0538  NA 141 140  K 3.7 4.3  CL 107 105  CO2 25 25  GLUCOSE 116* 108*  BUN 15 12  CREATININE 1.29* 0.91  CALCIUM 8.6* 8.9  PROT 6.7  --   ALBUMIN 4.0  --   AST 48*  --   ALT 43  --   ALKPHOS 58  --   BILITOT 0.7  --   GFRNONAA >60 >60  GFRAA >60 >60  ANIONGAP 9 10     Hematology Recent Labs  Lab 05/08/19 0538 05/09/19 0540 05/10/19 0302  WBC 10.6* 12.6*  8.6  RBC 4.88 5.44 5.02  HGB 15.0 16.5 15.3  HCT 43.6 49.7 44.1  MCV 89.3 91.4 87.8  MCH 30.7 30.3 30.5  MCHC 34.4 33.2 34.7  RDW 13.1 13.1 12.9  PLT 265 238 203    BNPNo results for input(s): BNP, PROBNP in the last 168 hours.   DDimer No results for input(s): DDIMER in the last 168 hours.   Radiology    DG Chest Port 1 View  Result Date: 05/08/2019 CLINICAL DATA:  SOB. Patient admitted after LOC, tachycardia, atrial flutter. Current smoker, methamphetamine user. EXAM: PORTABLE CHEST 1 VIEW COMPARISON:  None. FINDINGS: The heart size and mediastinal contours are within normal limits. There is coarsening of the interstitium bilaterally likely representing chronic bronchitic change. No focal infiltrate. No pneumothorax or large pleural effusion. Subacute to chronic right lateral rib fracture. IMPRESSION: 1. No acute cardiopulmonary process. 2. Chronic bronchitic change. Electronically Signed    By: Emmaline Kluver M.D.   On: 05/08/2019 15:35    Cardiac Studies   TTE 05/07/2018 1. Left ventricular ejection fraction, by visual estimation, is 25 to 30%. The left ventricle has severely decreased function. There is no left ventricular hypertrophy.  2. Definity contrast agent was given IV to delineate the left ventricular endocardial borders.  3. Indeterminate diastolic filling due to E-A fusion.  4. The left ventricle demonstrates global hypokinesis.  5. Global right ventricle has severely reduced systolic function.The right ventricular size is normal. Right vetricular wall thickness was not assessed.  6. Left atrial size was normal.  7. Right atrial size was normal.  8. The mitral valve is normal in structure. Trivial mitral valve regurgitation.  9. The tricuspid valve is normal in structure. 10. The aortic valve is normal in structure. Aortic valve regurgitation is not visualized. 11. The pulmonic valve was normal in structure. Pulmonic valve regurgitation is not visualized. 12. Normal pulmonary artery systolic pressure. 13. The inferior vena cava is normal in size with <50% respiratory variability, suggesting right atrial pressure of 8 mmHg   Assessment & Plan    Echocardiogram shows severely reduced ejection fraction with EF 25 to 30%.  Etiology for reduced ejection fraction is likely meth use.  He is euvolemic on my exam.  However, formal ischemic work-up (Lexi or left heart cath) will have to be performed after heart rates are adequately controlled.  Patient still tachycardic with heart rate in the 140s on Cardizem drip.  His head CT showed hyperdense fluid level in the left maxillary sinus suspicious for hemorrhage.  Recommend ENT input regarding safety of anticoagulation.  Patient will need to be anticoagulated if TEE and cardioversion is to be performed, hopefully on Monday.  1.  Atrial flutter, typical counterclockwise   2.  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  Syncope   Lengthy discussion regarding 1) atrial flutter, need for anticoagulation and risk of recurrence  Discussed RFCA and will plan in about 4 weeks time.  Change heparin to apixoban   2) cardiomyopathy  It has been described assoc (uptodate) with acute methamphetamine use, but could also be rate related or altogether different-- it is biventricular making ischemic a much less likely cause Will continue losartan and metoprolol, and increase both  3) syncope.  He has had LH with palps in the past, and SVT/AFlutter can trigger neurally mediated syncope, but his cardiomyopathy raises the specter of more malignant possibilities, ie VT  Hence he should be discharge withLifeVest.  Have reached  out today to local helper Dennis Bast) and will need to work on this tomorrow  Euvolemic   Would plan early followup for drug uptitration, ? Aldactone And scheduling of RFCA for flutter    Spoke with Dr Allena Katz   Signed, Sherryl Manges, MD  05/10/2019, 9:41 AM

## 2019-05-11 ENCOUNTER — Other Ambulatory Visit: Payer: Self-pay

## 2019-05-11 LAB — CREATININE, SERUM
Creatinine, Ser: 0.93 mg/dL (ref 0.61–1.24)
GFR calc Af Amer: >60 mL/min (ref >60)
GFR calc non Af Amer: >60 mL/min (ref >60)

## 2019-05-11 LAB — POTASSIUM: Potassium: 4.5 mmol/L (ref 3.5–5.1)

## 2019-05-11 MED ORDER — ADULT MULTIVITAMIN W/MINERALS CH: 1.0000 | ORAL_TABLET | Freq: Every day | ORAL | 0 refills | Status: AC

## 2019-05-11 MED ORDER — APIXABAN 5 MG PO TABS: 5.0000 mg | ORAL_TABLET | Freq: Two times a day (BID) | ORAL | 2 refills | Status: AC

## 2019-05-11 MED ORDER — INFLUENZA VAC SPLIT QUAD 0.5 ML IM SUSY
0.5000 mL | PREFILLED_SYRINGE | Freq: Once | INTRAMUSCULAR | Status: AC
  Administered 2019-05-11: 0.5 mL via INTRAMUSCULAR
  Filled 2019-05-11: qty 0.5

## 2019-05-11 MED ORDER — LOSARTAN POTASSIUM 25 MG PO TABS: 25.0000 mg | ORAL_TABLET | Freq: Two times a day (BID) | ORAL | 0 refills | Status: AC

## 2019-05-11 MED ORDER — LOSARTAN POTASSIUM 25 MG PO TABS: 25.0000 mg | ORAL_TABLET | Freq: Two times a day (BID) | ORAL | 0 refills | Status: DC

## 2019-05-11 MED ORDER — APIXABAN 5 MG PO TABS: 5.0000 mg | ORAL_TABLET | Freq: Two times a day (BID) | ORAL | 2 refills | Status: DC

## 2019-05-11 MED ORDER — METOPROLOL SUCCINATE ER 50 MG PO TB24: 50.0000 mg | ORAL_TABLET | Freq: Two times a day (BID) | ORAL | 2 refills | Status: DC

## 2019-05-11 NOTE — Clinical Social Work Note (Addendum)
Faxed referral paperwork to [facility] for lifevest.  Charlynn Court, CSW [phone]  1:32 pm Patient had a PCP until he lost his insurance. Gave booklet for free/low-cost healthcare in [facility] with intake paperwork for [facility]. Gave 30-day free card for Eliquis. Patient and wife can afford cost of Losartan and Metoprolol if they are sent to [id number] on [address]. MD aware. Patient has orders to discharge home today. No further concerns. CSW signing off.  Charlynn Court, CSW [phone]

## 2019-05-11 NOTE — Progress Notes (Signed)
Attempted to chart a note reference discharge and the date of discharge is wrong. See previous note from 05-08-19.

## 2019-05-11 NOTE — Discharge Summary (Signed)
[facility] - [place] at [facility]   PATIENT NAME: Tyler [name]    MR#:  [id number]  DATE OF BIRTH:  1970/07/19  DATE OF ADMISSION:  05/07/2019 ADMITTING PHYSICIAN: Thomasenia Bottoms, MD  DATE OF DISCHARGE: 05/11/2019  PRIMARY CARE PHYSICIAN: Patient, No Pcp Per    ADMISSION DIAGNOSIS:  Syncope and collapse [R55] Substance abuse (HCC) [F19.10] Atrial flutter, unspecified type (HCC) [I48.92]  DISCHARGE DIAGNOSIS:  atrial flutter-- now in sinus rhythm syncope with collapse left black eye severe cardiomyopathy-- EF 25 to 30% polysubstance drug abuse acute renal failure resolved SECONDARY DIAGNOSIS:   Past Medical History:  Diagnosis Date  . Hypertension     HOSPITAL COURSE:   Tyler [name] Tyler [name] a 49 y.o.malewith significant past history of drug abuse,smoking for 30+ years comes to the ED today with recurrent fall experiencing recently. Patient stated over the past couple of weeks he would feel short of breath suddenly and palpitation.   1)Atrial flutter--Rapid -Patient is OFF Cardizem drip -PO eliquis. Discontinue heparin drip --ENT consultation with Dr Wells Guiles appreciated - 2D cardiac echocardiogram shows severe cardiomyopathy EF of 25% suspected due to >methamphetamine use - life vest-- to be placed today prior to discharge -pt now on Toprol XL and losartan per Dr Harle Stanford  2)syncope with collapse and blackeye left with orbital base fracture and suspected mild hemorrhage in left maxillary sinus  -this is likely due to rapid a flutter fib -ENT consultation appreciated with Dr Wells Guiles   3) polysubstance drug abuse: -Patient admits using methamphetamine on a regular basis.He also admits using marijuana occasionally. drinks alcohol very seldomly. -As needed antihypertensives -Counseled on cessation -no signs of withdrawal  4)AKI: Creatinine found to be 1.29 today even though it was normal from 2014 -received IV fluids-- creatinine .9 -DC IV  fluids -Avoid potential nephrotoxic's and contrast materials  5)smoking: Admits to smoking 30+ years of regular cigarettes -Counseledon cessation  patient will discharge today after life vest has been placed. This was discussed with Eula Listen cardiology. Social worker working on life vest. pt is agreeable with the plan CONSULTS OBTAINED:  Treatment Team:  Debbe Odea, MD Tamela Gammon, MD  DRUG ALLERGIES:  No Known Allergies  DISCHARGE MEDICATIONS:   Allergies as of 05/11/2019   No Known Allergies     Medication List    TAKE these medications   apixaban 5 MG Tabs tablet Commonly known as: ELIQUIS Take 1 tablet (5 mg total) by mouth 2 (two) times daily.   losartan 25 MG tablet Commonly known as: COZAAR Take 1 tablet (25 mg total) by mouth 2 (two) times daily.   metoprolol succinate 50 MG 24 hr tablet Commonly known as: TOPROL-XL Take 1 tablet (50 mg total) by mouth 2 times daily at 12 noon and 4 pm. Take with or immediately following a meal.   multivitamin with minerals Tabs tablet Take 1 tablet by mouth daily. Start taking on: May 12, 2019       If you experience worsening of your admission symptoms, develop shortness of breath, life threatening emergency, suicidal or homicidal thoughts you must seek medical attention immediately by calling 911 or calling your MD immediately  if symptoms less severe.  You Must read complete instructions/literature along with all the possible adverse reactions/side effects for all the Medicines you take and that have been prescribed to you. Take any new Medicines after you have completely understood and accept all the possible adverse reactions/side effects.   Please note  You were cared  for by a hospitalist during your hospital stay. If you have any questions about your discharge medications or the care you received while you were in the hospital after you are discharged, you can call the unit and asked to speak with  the hospitalist on call if the hospitalist that took care of you is not available. Once you are discharged, your primary care physician will handle any further medical issues. Please note that NO REFILLS for any discharge medications will be authorized once you are discharged, as it is imperative that you return to your primary care physician (or establish a relationship with a primary care physician if you do not have one) for your aftercare needs so that they can reassess your need for medications and monitor your lab values. Today   SUBJECTIVE  overall doing well.   VITAL SIGNS:  Blood pressure (!) 133/96, pulse 76, temperature 98.7 F (37.1 C), temperature source Oral, resp. rate 19, height 5\' 8"  (1.727 m), weight 73.4 kg, SpO2 91 %.  I/O:    Intake/Output Summary (Last 24 hours) at 05/11/2019 1328 Last data filed at 05/11/2019 0900 Gross per 24 hour  Intake 600 ml  Output 2450 ml  Net -1850 ml    PHYSICAL EXAMINATION:  GENERAL:  49 y.o.-year-old patient lying in the bed with no acute distress.  EYES: Pupils equal, round, reactive to light and accommodation. No scleral icterus. Extraocular muscles intact.  HEENT: Head atraumatic, normocephalic. Oropharynx and nasopharynx clear. Left black eye NECK:  Supple, no jugular venous distention. No thyroid enlargement, no tenderness.  LUNGS: Normal breath sounds bilaterally, no wheezing, rales,rhonchi or crepitation. No use of accessory muscles of respiration.  CARDIOVASCULAR: S1, S2 normal. No murmurs, rubs, or gallops.  ABDOMEN: Soft, non-tender, non-distended. Bowel sounds present. No organomegaly or mass.  EXTREMITIES: No pedal edema, cyanosis, or clubbing.  NEUROLOGIC: Cranial nerves II through XII are intact. Muscle strength 5/5 in all extremities. Sensation intact. Gait not checked.  PSYCHIATRIC: The patient is alert and oriented x 3.  SKIN: No obvious rash, lesion, or ulcer.   DATA REVIEW:   CBC  Recent Labs  Lab 05/10/19 0302   WBC 8.6  HGB 15.3  HCT 44.1  PLT 203    Chemistries  Recent Labs  Lab 05/07/19 1219 05/07/19 1220 05/08/19 0538 05/11/19 0504  NA 141  --  140  --   K 3.7  --  4.3 4.5  CL 107  --  105  --   CO2 25  --  25  --   GLUCOSE 116*  --  108*  --   BUN 15  --  12  --   CREATININE 1.29*  --  0.91 0.93  CALCIUM 8.6*  --  8.9  --   MG  --  2.0  --   --   AST 48*  --   --   --   ALT 43  --   --   --   ALKPHOS 58  --   --   --   BILITOT 0.7  --   --   --     Microbiology Results   Recent Results (from the past 240 hour(s))  SARS CORONAVIRUS 2 (TAT 6-24 HRS) Nasopharyngeal Nasopharyngeal Swab     Status: None   Collection Time: 05/07/19  5:39 PM   Specimen: Nasopharyngeal Swab  Result Value Ref Range Status   SARS Coronavirus 2 NEGATIVE NEGATIVE Final    Comment: (NOTE) SARS-CoV-2 target nucleic acids  are NOT DETECTED. The SARS-CoV-2 RNA is generally detectable in upper and lower respiratory specimens during the acute phase of infection. Negative results do not preclude SARS-CoV-2 infection, do not rule out co-infections with other pathogens, and should not be used as the sole basis for treatment or other patient management decisions. Negative results must be combined with clinical observations, patient history, and epidemiological information. The expected result is Negative. Fact Sheet for Patients: HairSlick.no Fact Sheet for Healthcare Providers: quierodirigir.com This test is not yet approved or cleared by the [place] FDA and  has been authorized for detection and/or diagnosis of SARS-CoV-2 by FDA under an Emergency Use Authorization (EUA). This EUA will remain  in effect (meaning this test can be used) for the duration of the COVID-19 declaration under Section 56 4(b)(1) of the Act, 21 U.S.C. section 360bbb-3(b)(1), unless the authorization is terminated or revoked sooner. Performed at [facility], [address] [place], [place] [address]   MRSA PCR Screening     Status: None   Collection Time: 05/08/19 10:10 AM   Specimen: Nasal Mucosa; Nasopharyngeal  Result Value Ref Range Status   MRSA by PCR NEGATIVE NEGATIVE Final    Comment:        The GeneXpert MRSA Assay (FDA approved for NASAL specimens only), is one component of a comprehensive MRSA colonization surveillance program. It is not intended to diagnose MRSA infection nor to guide or monitor treatment for MRSA infections. Performed at [facility], [address] [place], [place] [address]     RADIOLOGY:  No results found.   CODE STATUS:     Code Status Orders  (From admission, onward)         Start     Ordered   05/07/19 1541  Full code  Continuous     05/07/19 1548        Code Status History    Date Active Date Inactive Code Status Order ID Comments User Context   05/07/2019 1548 05/07/2019 1548 Full Code [id number]  Thomasenia Bottoms, MD ED   Advance Care Planning Activity       TOTAL TIME TAKING CARE OF THIS PATIENT: *40* minutes.    Enedina Finner M.D on 05/11/2019 at 1:28 PM  Between 7am to 6pm - Pager - [phone] After 6pm go to [email or web address] - password [email or web address]  [facility]    CC: Primary care physician; Patient, No Pcp Per

## 2019-05-11 NOTE — Progress Notes (Signed)
Pt discharged home after Life Vest education. Discharge paperwork and education provided to pt and wife. Pt was escorted to POV via wheelchair and released to wife. Pt expressed no questions or concerns.

## 2019-05-11 NOTE — Progress Notes (Addendum)
Progress Note  Patient Name: Tyler [name] Date of Encounter: 05/11/2019  Primary Cardiologist: new to [facility] - consult by [facility]ang  Subjective   No chest pain, SOB, or palpitations. Maintaining sinus rhythm.  Inpatient Medications    Scheduled Meds: . apixaban  5 mg Oral BID  . Chlorhexidine Gluconate Cloth  6 each Topical Daily  . folic acid  1 mg Oral Daily  . influenza vac split quadrivalent PF  0.5 mL Intramuscular Tomorrow-1000  . losartan  25 mg Oral BID  . metoprolol succinate  50 mg Oral q12n4p  . multivitamin with minerals  1 tablet Oral Daily  . potassium chloride  20 mEq Oral BID  . sodium chloride flush  3 mL Intravenous Q12H  . thiamine  100 mg Oral Daily   Or  . thiamine  100 mg Intravenous Daily   Continuous Infusions:  PRN Meds: acetaminophen, ALPRAZolam, HYDROcodone-acetaminophen, ipratropium-albuterol, ondansetron (ZOFRAN) IV   Vital Signs    Vitals:   05/11/19 0200 05/11/19 0300 05/11/19 0400 05/11/19 0500  BP:  133/87 126/86 134/80  Pulse: 73 68 77 73  Resp: 20 12  15   Temp:    98.2 F (36.8 C)  TempSrc:    Oral  SpO2: 94% 92% 96% 95%  Weight:    73.4 kg  Height:        Intake/Output Summary (Last 24 hours) at 05/11/2019 1021 Last data filed at 05/11/2019 0900 Gross per 24 hour  Intake 840 ml  Output 2450 ml  Net -1610 ml   Filed Weights   05/07/19 1215 05/09/19 0500 05/11/19 0500  Weight: 77.1 kg 74.7 kg 73.4 kg    Telemetry    SR - Personally Reviewed  ECG    No new tracings - Personally Reviewed  Physical Exam   GEN: No acute distress. Contusion along the left orbit.  Neck: No JVD. Cardiac: RRR, no murmurs, rubs, or gallops.  Respiratory: Clear to auscultation bilaterally.  GI: Soft, nontender, non-distended.   MS: No edema; No deformity. Neuro:  Alert and oriented x 3; Nonfocal.  Psych: Normal affect.  Labs    Chemistry Recent Labs  Lab 05/07/19 1219 05/08/19 0538 05/11/19 0504  NA 141 140  --   K 3.7 4.3   --   CL 107 105  --   CO2 25 25  --   GLUCOSE 116* 108*  --   BUN 15 12  --   CREATININE 1.29* 0.91 0.93  CALCIUM 8.6* 8.9  --   PROT 6.7  --   --   ALBUMIN 4.0  --   --   AST 48*  --   --   ALT 43  --   --   ALKPHOS 58  --   --   BILITOT 0.7  --   --   GFRNONAA >60 >60 >60  GFRAA >60 >60 >60  ANIONGAP 9 10  --      Hematology Recent Labs  Lab 05/08/19 0538 05/09/19 0540 05/10/19 0302  WBC 10.6* 12.6* 8.6  RBC 4.88 5.44 5.02  HGB 15.0 16.5 15.3  HCT 43.6 49.7 44.1  MCV 89.3 91.4 87.8  MCH 30.7 30.3 30.5  MCHC 34.4 33.2 34.7  RDW 13.1 13.1 12.9  PLT 265 238 203    Cardiac EnzymesNo results for input(s): TROPONINI in the last 168 hours. No results for input(s): TROPIPOC in the last 168 hours.   BNPNo results for input(s): BNP, PROBNP in the last 168  hours.   DDimer No results for input(s): DDIMER in the last 168 hours.   Radiology    No results found.  Cardiac Studies   2D Echo 05/08/2019: 1. Left ventricular ejection fraction, by visual estimation, is 25 to 30%. The left ventricle has severely decreased function. There is no left ventricular hypertrophy.  2. Definity contrast agent was given IV to delineate the left ventricular endocardial borders.  3. Indeterminate diastolic filling due to E-A fusion.  4. The left ventricle demonstrates global hypokinesis.  5. Global right ventricle has severely reduced systolic function.The right ventricular size is normal. Right vetricular wall thickness was not assessed.  6. Left atrial size was normal.  7. Right atrial size was normal.  8. The mitral valve is normal in structure. Trivial mitral valve regurgitation.  9. The tricuspid valve is normal in structure. 10. The aortic valve is normal in structure. Aortic valve regurgitation is not visualized. 11. The pulmonic valve was normal in structure. Pulmonic valve regurgitation is not visualized. 12. Normal pulmonary artery systolic pressure. 13. The inferior vena cava is  normal in size with <50% respiratory variability, suggesting right atrial pressure of 8 mmHg.  Patient Profile     49 y.o. male with history of HTN, meth use, 30+ pack year tobacco use, syncope and palpitations who we are seeing for new onset atrial flutter with RVR and acute systolic CHF.  Assessment & Plan    1. Typical atrial flutter with RVR: -Maintaining sinus rhythm -Continue Toprol XL and Eliquis -Follow up with EP for consideration of RFCA -TSH normal -Potassium at goal -CHADS2VASc at least 2 (CHF, HTN)  2. 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 Syncope: -Cannot exclude malignant arrhythmia, plan for LifeVest prior to discharge vs SVT/flutter -Maintaining sinus rhythm on telemetry  -No driving x 6 months  For questions or updates, please contact [facility] Please consult [email or web address] for contact info under Cardiology/STEMI.    Signed, Eula Listen, PA-C [facility] Pager: [phone] 05/11/2019, 10:21 AM

## 2019-05-11 NOTE — Discharge Instructions (Signed)
Patient advised to abstain from polysubstance drug abuse

## 2019-05-22 ENCOUNTER — Telehealth: Payer: Self-pay

## 2019-05-22 ENCOUNTER — Ambulatory Visit: Payer: Self-pay | Admitting: Nurse Practitioner

## 2019-05-22 ENCOUNTER — Ambulatory Visit: Payer: Self-pay | Admitting: Family

## 2019-05-22 NOTE — Telephone Encounter (Signed)
Attempted to call patient. [facility] 05/22/2019

## 2019-05-22 NOTE — Progress Notes (Deleted)
Office Visit    Patient Name: Tyler [name] Date of Encounter: 05/22/2019  Primary Care Provider:  Patient, No Pcp Per Primary Cardiologist:  New-  Consult by Dr. Azucena Cecil in hospital Electrophysiologist:  None   Chief Complaint    Tyler [name] is a 49 y.o. male with a hx of *** presents today for hospital follow up of atrial flutter   Past Medical History    Past Medical History:  Diagnosis Date  . Hypertension    Past Surgical History:  Procedure Laterality Date  . NO PAST SURGERIES     Allergies  No Known Allergies  History of Present Illness    Tyler [name] is a 49 y.o. male with a hx of HTN, meth use, 30+ pack year tobacco use, syncope, atrial flutter, systolic CHF last seen while hospitalized.  Admitted 05/07/19 for syncope/fall. Also noted SOB and palpitations reported for the month prior. Noted to be in atrial flutter rate 150s on admission. He self converted while admitted. Echo 05/08/19 LVEF 25-30%, indeterminate diastolic filling, RV severely reduced function, normal RV size, LA/RA normal, trivial MR, elevated RA pressure. Low EF/cardiomyopathy thought to be tachy mediated and/or due to meth use. He was discharged 05/11/19 with a LifeVest.   At discharge cardiology recommendations were for follow up with EP for ablation, acute systolic heart failure thought to be tachycardiac induced, LifeVest and no driving for 6 month recommended by Dr. Graciela Husbands due to presentation with syncope as malignant arrhythmia could not be excluded.   ***  EKGs/Labs/Other Studies Reviewed:   The following studies were reviewed today:  Echo 05/08/19    1. Left ventricular ejection fraction, by visual estimation, is 25 to 30%. The left ventricle has severely decreased function. There is no left ventricular hypertrophy.  2. Definity contrast agent was given IV to delineate the left ventricular endocardial borders.  3. Indeterminate diastolic filling due to E-A fusion.  4. The left  ventricle demonstrates global hypokinesis.  5. Global right ventricle has severely reduced systolic function.The right ventricular size is normal. Right vetricular wall thickness was not assessed.  6. Left atrial size was normal.  7. Right atrial size was normal.  8. The mitral valve is normal in structure. Trivial mitral valve regurgitation.  9. The tricuspid valve is normal in structure. 10. The aortic valve is normal in structure. Aortic valve regurgitation is not visualized. 11. The pulmonic valve was normal in structure. Pulmonic valve regurgitation is not visualized. 12. Normal pulmonary artery systolic pressure. 13. The inferior vena cava is normal in size with <50% respiratory variability, suggesting right atrial pressure of 8 mmHg.  EKG:  EKG is ordered today.  The ekg ordered today demonstrates ***  Recent Labs: 05/07/2019: ALT 43; Magnesium 2.0; TSH 3.250 05/08/2019: BUN 12; Sodium 140 05/10/2019: Hemoglobin 15.3; Platelets 203 05/11/2019: Creatinine, Ser 0.93; Potassium 4.5  Recent Lipid Panel    Component Value Date/Time   CHOL 142 05/08/2019 0538   TRIG 102 05/08/2019 0538   HDL 61 05/08/2019 0538   CHOLHDL 2.3 05/08/2019 0538   VLDL 20 05/08/2019 0538   LDLCALC 61 05/08/2019 0538    Home Medications   No outpatient medications have been marked as taking for the 05/22/19 encounter (Appointment) with Alver Sorrow, NP.      Review of Systems    ***   ROS All other systems reviewed and are otherwise negative except as noted above.  Physical Exam    VS:  There  were no vitals taken for this visit. , BMI There is no height or weight on file to calculate BMI. GEN: Well nourished, well developed, in no acute distress. HEENT: normal. Neck: Supple, no JVD, carotid bruits, or masses. Cardiac: ***RRR, no murmurs, rubs, or gallops. No clubbing, cyanosis, edema.  ***Radials/DP/PT 2+ and equal bilaterally.  Respiratory:  ***Respirations regular and unlabored, clear to  auscultation bilaterally. GI: Soft, nontender, nondistended, BS + x 4. MS: No deformity or atrophy. Skin: Warm and dry, no rash. Neuro:  Strength and sensation are intact. Psych: Normal affect.  Accessory Clinical Findings    ECG personally reviewed by me today - *** - no acute changes.  Assessment & Plan    1. Typical atrial flutter - Self converted while hospitalized. 05/07/19 TSH 3.25. Recommended for f/u with EP for consideration of RFCA.   2. Current long term use of anticoagulation - Secondary to atrial flutter. CHADS2VASC of at least 2 (CHF, HTN).   3. 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 Likely tachy-mediated and/or secondary to meth use. Recommended for outpatient ischemic evaluation.   4. Syncope - Recommended no driving for 6 months. Discharged with LifeVest as malignant arrhythmia could not be excluded.   5. Substance abuse/Tobacco abuse - Methamphetamine cessation recommended. Smoking cessation encouraged. Recommend utilization of 1800QUITNOW.  Disposition: Follow up {follow up:15908} with ***   Loel Dubonnet, NP 05/22/2019, 9:06 AM

## 2019-05-22 NOTE — Telephone Encounter (Signed)
Call to patient to discuss appt that he missed today. No answer, no VM available. Tyler [name] would like for RN to further discuss previous POC. He has apparently missed other appt as well. He is needing SK appt.

## 2019-05-25 ENCOUNTER — Encounter: Payer: Self-pay | Admitting: Family

## 2019-05-25 NOTE — Telephone Encounter (Signed)
Attempted to call patient. LMTCB 05/25/2019   

## 2019-05-27 NOTE — Telephone Encounter (Signed)
3 rd attempt to reach patient about his missed appt last week. No answer, no Voicemail.   I sent letter to address on file for patient to call the office.

## 2019-07-29 ENCOUNTER — Telehealth: Payer: Self-pay | Admitting: Physician Assistant

## 2019-07-29 NOTE — Telephone Encounter (Signed)
Attempted to reach patient by calling 4 different numbers assigned to either patient or family member. Was only able to leave message on one number that had a VM to call back to discuss care and schedule f/u appointment.   Previously we tried to call patient several times without reaching him and mailed him a letter.  Routing to Dr Azucena Cecil for further advice.

## 2019-07-29 NOTE — Telephone Encounter (Signed)
Patient has a lifevest from 01/05 hospital dc and has not had a download since   Life vest rep unable to reach patient .  Patient no show here in clinic.    Please call to discuss order to be continued from now and or what POC Tyler [name] wants to do .

## 2019-07-30 NOTE — Telephone Encounter (Signed)
Debbe Odea, MD  You 17 hours ago (6:03 PM)   Nothing to add to what has already been done/attempted. Thank you   Routing comment

## 2019-08-03 ENCOUNTER — Encounter: Payer: Self-pay | Admitting: *Deleted

## 2019-08-03 NOTE — Telephone Encounter (Signed)
Letter sent to patient to contact office as soon as possible to arrange appointment and discuss treatment plan of care.

## 2019-08-03 NOTE — Telephone Encounter (Signed)
Please mail letter to patient.   Thanks,  Harriett Sine

## 2019-10-24 ENCOUNTER — Observation Stay: Payer: Self-pay

## 2019-10-24 ENCOUNTER — Emergency Department: Payer: Self-pay

## 2019-10-24 ENCOUNTER — Encounter: Payer: Self-pay | Admitting: Family Medicine

## 2019-10-24 ENCOUNTER — Observation Stay
Admission: EM | Admit: 2019-10-24 | Discharge: 2019-10-26 | Disposition: A | Discharge status: Home / Self Care | Payer: Self-pay | Attending: Internal Medicine | Admitting: Internal Medicine

## 2019-10-24 ENCOUNTER — Other Ambulatory Visit: Payer: Self-pay

## 2019-10-24 DIAGNOSIS — R079 Chest pain, unspecified: Principal | ICD-10-CM | POA: Insufficient documentation

## 2019-10-24 DIAGNOSIS — I10 Essential (primary) hypertension: Secondary | ICD-10-CM

## 2019-10-24 DIAGNOSIS — I5022 Chronic systolic (congestive) heart failure: Secondary | ICD-10-CM | POA: Insufficient documentation

## 2019-10-24 DIAGNOSIS — R9431 Abnormal electrocardiogram [ECG] [EKG]: Secondary | ICD-10-CM

## 2019-10-24 DIAGNOSIS — Z7982 Long term (current) use of aspirin: Secondary | ICD-10-CM | POA: Insufficient documentation

## 2019-10-24 DIAGNOSIS — Z7901 Long term (current) use of anticoagulants: Secondary | ICD-10-CM | POA: Insufficient documentation

## 2019-10-24 DIAGNOSIS — I480 Paroxysmal atrial fibrillation: Secondary | ICD-10-CM | POA: Insufficient documentation

## 2019-10-24 DIAGNOSIS — Z20822 Contact with and (suspected) exposure to covid-19: Secondary | ICD-10-CM | POA: Insufficient documentation

## 2019-10-24 DIAGNOSIS — F17210 Nicotine dependence, cigarettes, uncomplicated: Secondary | ICD-10-CM | POA: Insufficient documentation

## 2019-10-24 DIAGNOSIS — I110 Hypertensive heart disease with heart failure: Secondary | ICD-10-CM | POA: Insufficient documentation

## 2019-10-24 DIAGNOSIS — I700 Atherosclerosis of aorta: Secondary | ICD-10-CM | POA: Insufficient documentation

## 2019-10-24 DIAGNOSIS — Z79899 Other long term (current) drug therapy: Secondary | ICD-10-CM | POA: Insufficient documentation

## 2019-10-24 DIAGNOSIS — I4892 Unspecified atrial flutter: Secondary | ICD-10-CM

## 2019-10-24 DIAGNOSIS — I429 Cardiomyopathy, unspecified: Secondary | ICD-10-CM | POA: Insufficient documentation

## 2019-10-24 DIAGNOSIS — I483 Typical atrial flutter: Secondary | ICD-10-CM | POA: Insufficient documentation

## 2019-10-24 LAB — BASIC METABOLIC PANEL
Anion gap: 10 (ref 5–15)
BUN: 14 mg/dL (ref 6–20)
CO2: 25 mmol/L (ref 22–32)
Calcium: 8.8 mg/dL — ABNORMAL LOW (ref 8.9–10.3)
Chloride: 103 mmol/L (ref 98–111)
Creatinine, Ser: 0.96 mg/dL (ref 0.61–1.24)
GFR calc Af Amer: >60 mL/min (ref >60)
GFR calc non Af Amer: >60 mL/min (ref >60)
Glucose, Bld: 114 mg/dL — ABNORMAL HIGH (ref 70–99)
Potassium: 3.8 mmol/L (ref 3.5–5.1)
Sodium: 138 mmol/L (ref 135–145)

## 2019-10-24 LAB — CBC
HCT: 41.7 % (ref 39.0–52.0)
Hemoglobin: 14.2 g/dL (ref 13.0–17.0)
MCH: 31.1 pg (ref 26.0–34.0)
MCHC: 34.1 g/dL (ref 30.0–36.0)
MCV: 91.4 fL (ref 80.0–100.0)
Platelets: 249 10*3/uL (ref 150–400)
RBC: 4.56 MIL/uL (ref 4.22–5.81)
RDW: 12.5 % (ref 11.5–15.5)
WBC: 7.8 10*3/uL (ref 4.0–10.5)
nRBC: 0 % (ref 0.0–0.2)

## 2019-10-24 LAB — PROTIME-INR
INR: 0.9 (ref 0.8–1.2)
Prothrombin Time: 11.8 seconds (ref 11.4–15.2)

## 2019-10-24 LAB — SARS CORONAVIRUS 2 BY RT PCR (HOSPITAL ORDER, PERFORMED IN ~~LOC~~ HOSPITAL LAB): SARS Coronavirus 2: NEGATIVE

## 2019-10-24 LAB — TROPONIN I (HIGH SENSITIVITY)
Troponin I (High Sensitivity): 8 ng/L (ref <18)
Troponin I (High Sensitivity): 7 ng/L (ref <18)

## 2019-10-24 MED ORDER — SODIUM CHLORIDE 0.9 % IV SOLN: INTRAVENOUS | Status: DC

## 2019-10-24 MED ORDER — SODIUM CHLORIDE 0.9% FLUSH: 3.0000 mL | Freq: Once | INTRAVENOUS | Status: DC

## 2019-10-24 MED ORDER — INSULIN ASPART 100 UNIT/ML ~~LOC~~ SOLN
SUBCUTANEOUS | Status: AC
  Filled 2019-10-24: qty 1

## 2019-10-24 MED ORDER — ALPRAZOLAM 0.25 MG PO TABS
0.2500 mg | ORAL_TABLET | Freq: Two times a day (BID) | ORAL | Status: DC | PRN
  Administered 2019-10-24: 0.25 mg via ORAL
  Filled 2019-10-24: qty 1

## 2019-10-24 MED ORDER — MORPHINE SULFATE (PF) 2 MG/ML IV SOLN: 2.0000 mg | INTRAVENOUS | Status: DC | PRN

## 2019-10-24 MED ORDER — METOPROLOL SUCCINATE ER 50 MG PO TB24
50.0000 mg | ORAL_TABLET | Freq: Two times a day (BID) | ORAL | Status: DC
  Administered 2019-10-25 – 2019-10-26 (×4): 50 mg via ORAL
  Filled 2019-10-24 (×4): qty 1

## 2019-10-24 MED ORDER — ASPIRIN EC 325 MG PO TBEC
325.0000 mg | DELAYED_RELEASE_TABLET | Freq: Every day | ORAL | Status: DC
  Administered 2019-10-25 – 2019-10-26 (×2): 325 mg via ORAL
  Filled 2019-10-24 (×2): qty 1

## 2019-10-24 MED ORDER — APIXABAN 5 MG PO TABS
5.0000 mg | ORAL_TABLET | Freq: Two times a day (BID) | ORAL | Status: DC
  Administered 2019-10-25 – 2019-10-26 (×3): 5 mg via ORAL
  Filled 2019-10-24 (×3): qty 1

## 2019-10-24 MED ORDER — IOHEXOL 350 MG/ML SOLN
75.0000 mL | Freq: Once | INTRAVENOUS | Status: AC | PRN
  Administered 2019-10-24: 75 mL via INTRAVENOUS

## 2019-10-24 MED ORDER — LIDOCAINE VISCOUS HCL 2 % MT SOLN
15.0000 mL | Freq: Once | OROMUCOSAL | Status: DC
  Filled 2019-10-24: qty 15

## 2019-10-24 MED ORDER — ZOLPIDEM TARTRATE 5 MG PO TABS: 5.0000 mg | ORAL_TABLET | Freq: Every evening | ORAL | Status: DC | PRN

## 2019-10-24 MED ORDER — NITROGLYCERIN 0.4 MG SL SUBL: 0.4000 mg | SUBLINGUAL_TABLET | SUBLINGUAL | Status: DC | PRN

## 2019-10-24 MED ORDER — ALUM & MAG HYDROXIDE-SIMETH 200-200-20 MG/5ML PO SUSP: 30.0000 mL | Freq: Once | ORAL | Status: DC

## 2019-10-24 MED ORDER — NITROGLYCERIN 2 % TD OINT
0.5000 [in_us] | TOPICAL_OINTMENT | TRANSDERMAL | Status: AC
  Administered 2019-10-24: 0.5 [in_us] via TOPICAL
  Filled 2019-10-24: qty 1

## 2019-10-24 MED ORDER — ONDANSETRON HCL 4 MG/2ML IJ SOLN: 4.0000 mg | Freq: Four times a day (QID) | INTRAMUSCULAR | Status: DC | PRN

## 2019-10-24 MED ORDER — ACETAMINOPHEN 325 MG PO TABS
650.0000 mg | ORAL_TABLET | Freq: Once | ORAL | Status: AC
  Administered 2019-10-24: 650 mg via ORAL
  Filled 2019-10-24: qty 2

## 2019-10-24 MED ORDER — LOSARTAN POTASSIUM 25 MG PO TABS
25.0000 mg | ORAL_TABLET | Freq: Two times a day (BID) | ORAL | Status: DC
  Administered 2019-10-25 – 2019-10-26 (×3): 25 mg via ORAL
  Filled 2019-10-24 (×3): qty 1

## 2019-10-24 MED ORDER — ACETAMINOPHEN 325 MG PO TABS
650.0000 mg | ORAL_TABLET | ORAL | Status: DC | PRN
  Administered 2019-10-24 – 2019-10-26 (×2): 650 mg via ORAL
  Filled 2019-10-24 (×2): qty 2

## 2019-10-24 MED ORDER — ADULT MULTIVITAMIN W/MINERALS CH
1.0000 | ORAL_TABLET | Freq: Every day | ORAL | Status: DC
  Administered 2019-10-25 – 2019-10-26 (×2): 1 via ORAL
  Filled 2019-10-24 (×2): qty 1

## 2019-10-24 NOTE — ED Notes (Signed)
Pt back to room att 

## 2019-10-24 NOTE — H&P (Signed)
[place] [facility] [place] NAME: Tyler [name]    MR#:  [id number]  DATE OF BIRTH:  07-15-1970  DATE OF ADMISSION:  10/24/2019  PRIMARY CARE PHYSICIAN: Patient, No Pcp Per   REQUESTING/REFERRING PHYSICIAN: Delman Kitten, MD  CHIEF COMPLAINT:   Chief Complaint  Patient presents with  . Chest Pain    HISTORY OF PRESENT ILLNESS:  Tyler [name]  is a 49 y.o. Caucasian male with a known history of hypertension, atrial flutter, systolic CHF and cardiomyopathy as well as migraine, who presented to the emergency room with acute onset of left sided chest pain felt as pressure with associated nausea without vomiting. He admitted to diaphoresis with this pain as well as dyspnea and palpitations. 90 cough or wheezing or hemoptysis. No leg pain or edema or recent travels or surgeries. No bleeding diathesis.  Conversation to the emergency room, blood pressure was 144/93 with a pulse of 112 and otherwise normal vital signs. Labs revealed unremarkable BMP and CBC. COVID-19 PCR came back negative. Two-view chest x-ray showed no acute cardiopulmonary disease. It did show streaky left lung base atelectasis and aortic atherosclerosis. EKG initially showed sinus tachycardia with a rate of 112 with nonspecific intraventricular conduction delay and to inversion laterally and later EKG after Salena nitroglycerin and Nitropaste revealed sinus rhythm with a rate of 78 with short PR interval and RVH with repolarization abnormality with slightly less T wave inversion laterally.  The patient was given 650 mg p.o. Tylenol and 1 inch of Nitropaste. He will be admitted to an observation progressive unit bed for further evaluation and management.  PAST MEDICAL HISTORY:   Past Medical History:  Diagnosis Date  . Hypertension   Atrial flutter, heart failure with reduced ejection fraction, cardiomyopathy and migraine,  PAST SURGICAL HISTORY:   Past Surgical History:  Procedure Laterality Date  . NO  PAST SURGERIES      SOCIAL HISTORY:   Social History   Tobacco Use  . Smoking status: Current Every Day Smoker    Packs/day: 1.00  . Smokeless tobacco: Never Used  Substance Use Topics  . Alcohol use: Yes    Comment: beer daily    FAMILY HISTORY:  No family history on file. No reported pertinent familial diseases. DRUG ALLERGIES:  No Known Allergies  REVIEW OF SYSTEMS:   ROS As per history of present illness. All pertinent systems were reviewed above. Constitutional,  HEENT, cardiovascular, respiratory, GI, GU, musculoskeletal, neuro, psychiatric, endocrine,  integumentary and hematologic systems were reviewed and are otherwise  negative/unremarkable except for positive findings mentioned above in the HPI.   MEDICATIONS AT HOME:   Prior to Admission medications   Medication Sig Start Date End Date Taking? Authorizing Provider  apixaban (ELIQUIS) 5 MG TABS tablet Take 1 tablet (5 mg total) by mouth 2 (two) times daily. 05/11/19   [name], MD  losartan (COZAAR) 25 MG tablet Take 1 tablet (25 mg total) by mouth 2 (two) times daily. 05/11/19   [name], MD  metoprolol succinate (TOPROL-XL) 50 MG 24 hr tablet Take 1 tablet (50 mg total) by mouth 2 times daily at 12 noon and 4 pm. Take with or immediately following a meal. 05/11/19   [name], MD  Multiple Vitamin (MULTIVITAMIN WITH MINERALS) TABS tablet Take 1 tablet by mouth daily. 05/12/19   [name], MD      VITAL SIGNS:  Blood pressure 135/84, pulse 71, temperature 98.7 F (37.1 C), temperature source Oral, resp. rate  15, height 5\' 8"  (1.727 m), weight 72.6 kg, SpO2 95 %.  PHYSICAL EXAMINATION:  Physical Exam  GENERAL:  49 y.o.-year-old Caucasian male patient lying in the bed with no acute distress.  EYES: Pupils equal, round, reactive to light and accommodation. No scleral icterus. Extraocular muscles intact.  HEENT: Head atraumatic, normocephalic. Oropharynx and nasopharynx clear.  NECK:  Supple, no jugular  venous distention. No thyroid enlargement, no tenderness.  LUNGS: Normal breath sounds bilaterally, no wheezing, rales,rhonchi or crepitation. No use of accessory muscles of respiration.  CARDIOVASCULAR: Regular rate and rhythm, S1, S2 normal. No murmurs, rubs, or gallops.  ABDOMEN: Soft, nondistended, nontender. Bowel sounds present. No organomegaly or mass.  EXTREMITIES: No pedal edema, cyanosis, or clubbing.  NEUROLOGIC: Cranial nerves II through XII are intact. Muscle strength 5/5 in all extremities. Sensation intact. Gait not checked.  PSYCHIATRIC: The patient is alert and oriented x 3.  Normal affect and good eye contact. SKIN: No obvious rash, lesion, or ulcer.   LABORATORY PANEL:   CBC Recent Labs  Lab 10/24/19 1939  WBC 7.8  HGB 14.2  HCT 41.7  PLT 249   ------------------------------------------------------------------------------------------------------------------  Chemistries  Recent Labs  Lab 10/24/19 1939  NA 138  K 3.8  CL 103  CO2 25  GLUCOSE 114*  BUN 14  CREATININE 0.96  CALCIUM 8.8*   ------------------------------------------------------------------------------------------------------------------  Cardiac Enzymes No results for input(s): TROPONINI in the last 168 hours. ------------------------------------------------------------------------------------------------------------------  RADIOLOGY:  DG Chest 2 View  Result Date: 10/24/2019 CLINICAL DATA:  Chest pain. Recent hospital admission and discharge from ICU with external pacemaker. Onset of chest pain and shortness of breath after drinking 2 energy drinks. EXAM: CHEST - 2 VIEW COMPARISON:  Radiograph 05/08/2019 FINDINGS: EKG leads overlie the chest.The cardiomediastinal contours are normal. Aortic atherosclerosis. Central bronchial thickening is chronic and likely smoking related. Streaky left lung base atelectasis. Pulmonary vasculature is normal. No consolidation, pleural effusion, or  pneumothorax. No acute osseous abnormalities are seen. Remote right rib fracture unchanged. IMPRESSION: Streaky left lung base atelectasis. Aortic Atherosclerosis (ICD10-I70.0). Electronically Signed   By: 07/06/2019 M.D.   On: 10/24/2019 20:33      IMPRESSION AND PLAN:   1. Chest pain, rule out acute coronary syndrome, with abnormal EKG, with associated sinus tachycardia. -The patient will be admitted on observation progressive cardiac unit bed. -We will follow serial troponin I's. -We will obtain chest CTA to rule out PE. -He will be placed on aspirin as well as as needed sublingual nitroglycerin and morphine sulfate for pain. -A cardiology consultation will be obtained in a.m. for further cardiac risk stratification. -I notified Dr. 10/26/2019 about the patient.  2. Paroxysmal atrial flutter on Eliquis, currently normal sinus rhythm. -We will continue Toprol-XL and Eliquis.  3. Hypertension. -We will continue Cozaar and Toprol-XL.  4. Tobacco abuse. -The patient will be counseled for smoking cessation.  5. DVT prophylaxis. -We will continue Eliquis.   All the records are reviewed and case discussed with ED provider. The plan of care was discussed in details with the patient (and family). I answered all questions. The patient agreed to proceed with the above mentioned plan. Further management will depend upon hospital course.   CODE STATUS: Full code  Status is: Observation  The patient remains OBS appropriate and will d/c before 2 midnights.  Dispo: The patient is from: Home              Anticipated d/c is to: Home  Anticipated d/c date is: 1 day              Patient currently is not medically stable to d/c.   TOTAL TIME TAKING CARE OF THIS PATIENT: 50 minutes.    Hannah Beat M.D on 10/24/2019 at 10:38 PM  [facility]   From 7 PM-7 AM, contact night-coverage [email or web address]  CC: Primary care physician; Patient, No Pcp Per   Note: This  dictation was prepared with Dragon dictation along with smaller phrase technology. Any transcriptional typo errors that result from this process are unintentional.

## 2019-10-24 NOTE — ED Provider Notes (Signed)
[facility] Emergency Department Provider Note   ____________________________________________   First MD Initiated Contact with Patient 10/24/19 1932     (approximate)  I have reviewed the triage vital signs and the nursing notes.   HISTORY  Chief Complaint Chest Pain    HPI Tyler [name] is a 49 y.o. male presents for evaluation of chest pain  Patient reports just prior to arrival he was going to gas station when he suddenly experienced sudden chest pain.  Located below his breastbone for like a severe tightening.  Relieved by treatment with EMS including aspirin nitroglycerin although he still has a sense of slight tightness but overall much better  Does have a history of heart disease.  Reports he supposed to wear a heart vest but does not always wear it when he is out and about  No sharp chest pains.  No leg swelling.  History of same in the past.  States history of heart disease   Past Medical History:  Diagnosis Date  . Hypertension     Patient Active Problem List   Diagnosis Date Noted  . HFrEF (heart failure with reduced ejection fraction) ([place])   . Substance abuse ([place])   . Cardiomyopathy ([place])   . Syncope and collapse 05/07/2019  . Atrial flutter ([place])   . Essential hypertension     Past Surgical History:  Procedure Laterality Date  . NO PAST SURGERIES      Prior to Admission medications   Medication Sig Start Date End Date Taking? Authorizing Provider  apixaban (ELIQUIS) 5 MG TABS tablet Take 1 tablet (5 mg total) by mouth 2 (two) times daily. 05/11/19   [name], MD  losartan (COZAAR) 25 MG tablet Take 1 tablet (25 mg total) by mouth 2 (two) times daily. 05/11/19   [name], MD  metoprolol succinate (TOPROL-XL) 50 MG 24 hr tablet Take 1 tablet (50 mg total) by mouth 2 times daily at 12 noon and 4 pm. Take with or immediately following a meal. 05/11/19   [name], MD  Multiple Vitamin (MULTIVITAMIN WITH MINERALS) TABS tablet  Take 1 tablet by mouth daily. 05/12/19   [name], MD    Allergies Patient has no known allergies.  No family history on file.  Social History Social History   Tobacco Use  . Smoking status: Current Every Day Smoker    Packs/day: 1.00  . Smokeless tobacco: Never Used  Substance Use Topics  . Alcohol use: Yes    Comment: beer daily  . Drug use: Yes    Types: Methamphetamines    Review of Systems Constitutional: No fever/chills Eyes: No visual changes. ENT: No sore throat. Cardiovascular: See HPI respiratory: Denies shortness of breath. Gastrointestinal: No abdominal pain.   Genitourinary: Negative for dysuria. Musculoskeletal: Negative for back pain. Skin: Negative for rash. Neurological: Negative for headaches, areas of focal weakness or numbness.    ____________________________________________   PHYSICAL EXAM:  VITAL SIGNS: ED Triage Vitals  Enc Vitals Group     BP 10/24/19 1934 (!) 144/93     Pulse Rate 10/24/19 1934 (!) 112     Resp 10/24/19 1934 19     Temp 10/24/19 1940 98.7 F (37.1 C)     Temp Source 10/24/19 1940 Oral     SpO2 10/24/19 1934 96 %     Weight 10/24/19 1933 160 lb (72.6 kg)     Height 10/24/19 1933 5\' 8"  (1.727 m)     Head Circumference --  Peak Flow --      Pain Score 10/24/19 1930 2     Pain Loc --      Pain Edu? --      Excl. in GC? --     Constitutional: Alert and oriented. Well appearing and in no acute distress. Eyes: Conjunctivae are normal. Head: Atraumatic. Nose: No congestion/rhinnorhea. Mouth/Throat: Mucous membranes are moist. Neck: No stridor.  Cardiovascular: Normal rate, regular rhythm. Grossly normal heart sounds.  Good peripheral circulation. Respiratory: Normal respiratory effort.  No retractions. Lungs CTAB. Gastrointestinal: Soft and nontender. No distention. Musculoskeletal: No lower extremity tenderness nor edema. Neurologic:  Normal speech and language. No gross focal neurologic deficits are  appreciated.  Skin:  Skin is warm, dry and intact. No rash noted. Psychiatric: Mood and affect are normal. Speech and behavior are normal.  ____________________________________________   LABS (all labs ordered are listed, but only abnormal results are displayed)  Labs Reviewed  BASIC METABOLIC PANEL - Abnormal; Notable for the following components:      Result Value   Glucose, Bld 114 (*)    Calcium 8.8 (*)    All other components within normal limits  SARS CORONAVIRUS 2 BY RT PCR (HOSPITAL ORDER, PERFORMED IN [facility])  CBC  PROTIME-INR  TROPONIN I (HIGH SENSITIVITY)  TROPONIN I (HIGH SENSITIVITY)   ____________________________________________  EKG  Reviewed interpreted by me at 1940 Heart rate 110 QRS 120 QTc 500 Sinus tachycardia, mild nonspecific T wave [place] seen mostly in inferolateral region ____________________________________________  RADIOLOGY  DG Chest 2 View  Result Date: 10/24/2019 CLINICAL DATA:  Chest pain. Recent hospital admission and discharge from ICU with external pacemaker. Onset of chest pain and shortness of breath after drinking 2 energy drinks. EXAM: CHEST - 2 VIEW COMPARISON:  Radiograph 05/08/2019 FINDINGS: EKG leads overlie the chest.The cardiomediastinal contours are normal. Aortic atherosclerosis. Central bronchial thickening is chronic and likely smoking related. Streaky left lung base atelectasis. Pulmonary vasculature is normal. No consolidation, pleural effusion, or pneumothorax. No acute osseous abnormalities are seen. Remote right rib fracture unchanged. IMPRESSION: Streaky left lung base atelectasis. Aortic Atherosclerosis (ICD10-I70.0). Electronically Signed   By: Narda Rutherford M.D.   On: 10/24/2019 20:33    Chest x-ray reviewed, left lung atelectasis. ____________________________________________   PROCEDURES  Procedure(s) performed: None  Procedures  Critical Care performed:  No  ____________________________________________   INITIAL IMPRESSION / ASSESSMENT AND PLAN / ED COURSE  Pertinent labs & imaging results that were available during my care of the patient were reviewed by me and considered in my medical decision making (see chart for details).   Differential diagnosis includes, but is not limited to, ACS, aortic dissection, pulmonary embolism, cardiac tamponade, pneumothorax, pneumonia, pericarditis, myocarditis, GI-related causes including esophagitis/gastritis, and musculoskeletal chest wall pain.    Known history of reduced EF.  Anticoagulated.  ----------------------------------------- 9:13 PM on 10/24/2019 -----------------------------------------  Patient reports current pain and symptoms are gone.  Full relief of symptoms at this time.  Given concern for possible dynamic EKG changes with EMS, will admit the patient for further work-up for concerns of possible risk of ACS.  Further work-up and care under the hospitalist service.    Admit discussed with Dr. Arville Care.  Patient understanding agreeable with admission.  Thus far troponins reassuring.  Currently pain-free  ____________________________________________   FINAL CLINICAL IMPRESSION(S) / ED DIAGNOSES  Final diagnoses:  Moderate risk chest pain        Note:  This document was prepared using Dragon voice  recognition software and may include unintentional dictation errors       Sharyn Creamer, MD 10/24/19 2148

## 2019-10-24 NOTE — ED Triage Notes (Signed)
Pt comes EMS after having 2 energy drinks at 5pm. Pt recently d/c from ICU with an external pacemaker. Pt having CP and SOB after drinking the drinks. Pt on 4L [employer] because of the SOB. Has had 2 nitro sprays and 324mg  aspirin.

## 2019-10-25 ENCOUNTER — Encounter: Payer: Self-pay | Admitting: Family Medicine

## 2019-10-25 DIAGNOSIS — I480 Paroxysmal atrial fibrillation: Secondary | ICD-10-CM

## 2019-10-25 DIAGNOSIS — I5022 Chronic systolic (congestive) heart failure: Secondary | ICD-10-CM

## 2019-10-25 DIAGNOSIS — R072 Precordial pain: Secondary | ICD-10-CM

## 2019-10-25 LAB — LIPID PANEL
Cholesterol: 205 mg/dL — ABNORMAL HIGH (ref 0–200)
HDL: 77 mg/dL (ref >40)
LDL Cholesterol: 107 mg/dL — ABNORMAL HIGH (ref 0–99)
Total CHOL/HDL Ratio: 2.7 RATIO
Triglycerides: 104 mg/dL (ref <150)
VLDL: 21 mg/dL (ref 0–40)

## 2019-10-25 LAB — HIV ANTIBODY (ROUTINE TESTING W REFLEX): HIV Screen 4th Generation wRfx: NONREACTIVE

## 2019-10-25 LAB — TROPONIN I (HIGH SENSITIVITY)
Troponin I (High Sensitivity): 7 ng/L (ref <18)
Troponin I (High Sensitivity): 8 ng/L (ref <18)

## 2019-10-25 MED ORDER — IPRATROPIUM-ALBUTEROL 0.5-2.5 (3) MG/3ML IN SOLN
3.0000 mL | Freq: Four times a day (QID) | RESPIRATORY_TRACT | Status: DC
  Administered 2019-10-25 – 2019-10-26 (×5): 3 mL via RESPIRATORY_TRACT
  Filled 2019-10-25 (×6): qty 3

## 2019-10-25 MED ORDER — HYDRALAZINE HCL 50 MG PO TABS: 50.0000 mg | ORAL_TABLET | Freq: Four times a day (QID) | ORAL | Status: DC | PRN

## 2019-10-25 NOTE — Progress Notes (Signed)
PROGRESS NOTE    Tyler [name]  YCX:[id number] DOB: 1970/10/21 DOA: 10/24/2019 PCP: Patient, No Pcp Per    Assessment & Plan:   Active Problems:   Chest pain   Chest pain: etiology unclear. Tenderness to palpation of chest. Troponins are neg. Morphine, nitro prn. Continue on tele. CTA neg for PE. Cardio following and recs apprec  PAF: continue on metoprolol & eliquis   Chronic systolic CHF: euvolemic. Echo ordered.   Hypertension: continue on losartan and metoprolol.  Tobacco abuse: smoking cessation counseling    DVT prophylaxis: eliquis Code Status: full  Family Communication:  Disposition Plan: depends on cardio recs/tests/procedures, etc   Consultants:   Cardio    Procedures:    Antimicrobials   Subjective: Pt c/o chest pain   Objective: Vitals:   10/25/19 0400 10/25/19 0500 10/25/19 0600 10/25/19 0714  BP: (!) 149/90 (!) 148/90 (!) 140/93 (!) 146/87  Pulse: (!) 52 70 70 70  Resp: 13 13 13 13   Temp:      TempSrc:      SpO2: 99% 97% 97% 100%  Weight:      Height:       No intake or output data in the 24 hours ending 10/25/19 0800 Filed Weights   10/24/19 1933  Weight: 72.6 kg    Examination:  General exam: Appears calm and comfortable  Respiratory system: Clear to auscultation. Respiratory effort normal. No wheezes, rales, rhonchi  Cardiovascular system: S1 & S2+. No rubs, gallops or clicks. No pedal edema. Gastrointestinal system: Abdomen is nondistended, soft and nontender.  Normal bowel sounds heard. Central nervous system: Alert and oriented. Moves all 4 extremities  Psychiatry: Judgement and insight appear normal. Flat mood and affect.     Data Reviewed: I have personally reviewed following labs and imaging studies  CBC: Recent Labs  Lab 10/24/19 1939  WBC 7.8  HGB 14.2  HCT 41.7  MCV 91.4  PLT 249   Basic Metabolic Panel: Recent Labs  Lab 10/24/19 1939  NA 138  K 3.8  CL 103  CO2 25  GLUCOSE 114*  BUN 14    CREATININE 0.96  CALCIUM 8.8*   GFR: Estimated Creatinine Clearance: 92 mL/min (by C-G formula based on SCr of 0.96 mg/dL). Liver Function Tests: No results for input(s): AST, ALT, ALKPHOS, BILITOT, PROT, ALBUMIN in the last 168 hours. No results for input(s): LIPASE, AMYLASE in the last 168 hours. No results for input(s): AMMONIA in the last 168 hours. Coagulation Profile: Recent Labs  Lab 10/24/19 1939  INR 0.9   Cardiac Enzymes: No results for input(s): CKTOTAL, CKMB, CKMBINDEX, TROPONINI in the last 168 hours. BNP (last 3 results) No results for input(s): PROBNP in the last 8760 hours. HbA1C: No results for input(s): HGBA1C in the last 72 hours. CBG: No results for input(s): GLUCAP in the last 168 hours. Lipid Profile: Recent Labs    10/25/19 0636  CHOL 205*  HDL 77  LDLCALC 107*  TRIG 104  CHOLHDL 2.7   Thyroid Function Tests: No results for input(s): TSH, T4TOTAL, FREET4, T3FREE, THYROIDAB in the last 72 hours. Anemia Panel: No results for input(s): VITAMINB12, FOLATE, FERRITIN, TIBC, IRON, RETICCTPCT in the last 72 hours. Sepsis Labs: No results for input(s): PROCALCITON, LATICACIDVEN in the last 168 hours.  Recent Results (from the past 240 hour(s))  SARS Coronavirus 2 by RT PCR (hospital order, performed in [facility] hospital lab) Nasopharyngeal Nasopharyngeal Swab     Status: None   Collection Time: 10/24/19  9:24 PM   Specimen: Nasopharyngeal Swab  Result Value Ref Range Status   SARS Coronavirus 2 NEGATIVE NEGATIVE Final    Comment: (NOTE) SARS-CoV-2 target nucleic acids are NOT DETECTED.  The SARS-CoV-2 RNA is generally detectable in upper and lower respiratory specimens during the acute phase of infection. The lowest concentration of SARS-CoV-2 viral copies this assay can detect is 250 copies / mL. A negative result does not preclude SARS-CoV-2 infection and should not be used as the sole basis for treatment or other patient management  decisions.  A negative result may occur with improper specimen collection / handling, submission of specimen other than nasopharyngeal swab, presence of viral mutation(s) within the areas targeted by this assay, and inadequate number of viral copies (<250 copies / mL). A negative result must be combined with clinical observations, patient history, and epidemiological information.  Fact Sheet for Patients:   BoilerBrush.com.cy  Fact Sheet for Healthcare Providers: https://pope.com/  This test is not yet approved or  cleared by the [place] FDA and has been authorized for detection and/or diagnosis of SARS-CoV-2 by FDA under an Emergency Use Authorization (EUA).  This EUA will remain in effect (meaning this test can be used) for the duration of the COVID-19 declaration under Section 564(b)(1) of the Act, 21 U.S.C. section 360bbb-3(b)(1), unless the authorization is terminated or revoked sooner.  Performed at [facility], [address], [place], [place] [address]          Radiology Studies: DG Chest 2 View  Result Date: 10/24/2019 CLINICAL DATA:  Chest pain. Recent hospital admission and discharge from ICU with external pacemaker. Onset of chest pain and shortness of breath after drinking 2 energy drinks. EXAM: CHEST - 2 VIEW COMPARISON:  Radiograph 05/08/2019 FINDINGS: EKG leads overlie the chest.The cardiomediastinal contours are normal. Aortic atherosclerosis. Central bronchial thickening is chronic and likely smoking related. Streaky left lung base atelectasis. Pulmonary vasculature is normal. No consolidation, pleural effusion, or pneumothorax. No acute osseous abnormalities are seen. Remote right rib fracture unchanged. IMPRESSION: Streaky left lung base atelectasis. Aortic Atherosclerosis (ICD10-I70.0). Electronically Signed   By: Narda Rutherford M.D.   On: 10/24/2019 20:33   CT ANGIO CHEST PE W OR WO  CONTRAST  Result Date: 10/24/2019 CLINICAL DATA:  49 year old male with shortness of breath and chest pain. EXAM: CT ANGIOGRAPHY CHEST WITH CONTRAST TECHNIQUE: Multidetector CT imaging of the chest was performed using the standard protocol during bolus administration of intravenous contrast. Multiplanar CT image reconstructions and MIPs were obtained to evaluate the vascular anatomy. CONTRAST:  36mL OMNIPAQUE IOHEXOL 350 MG/ML SOLN COMPARISON:  Chest radiograph dated 10/24/2019. FINDINGS: Cardiovascular: There is no cardiomegaly or pericardial effusion. The thoracic aorta is unremarkable. The origins of the great vessels of the aortic arch appear patent. No CT evidence of pulmonary embolism. Mediastinum/Nodes: There is no hilar or mediastinal adenopathy. The esophagus and the thyroid gland are grossly unremarkable. No mediastinal fluid collection. Lungs/Pleura: Bibasilar linear atelectasis/scarring. No focal consolidation, pleural effusion, or pneumothorax. The central airways are patent. Upper Abdomen: Mild left adrenal thickening. The visualized upper abdomen is otherwise unremarkable. Musculoskeletal: No chest wall abnormality. No acute or significant osseous findings. Review of the MIP images confirms the above findings. IMPRESSION: No acute intrathoracic pathology. No CT evidence of pulmonary embolism. Electronically Signed   By: Elgie Collard M.D.   On: 10/24/2019 23:35        Scheduled Meds: . alum & mag hydroxide-simeth  30 mL Oral Once   And  .  lidocaine  15 mL Oral Once  . apixaban  5 mg Oral BID  . aspirin EC  325 mg Oral Daily  . losartan  25 mg Oral BID  . metoprolol succinate  50 mg Oral q12n4p  . multivitamin with minerals  1 tablet Oral Daily  . sodium chloride flush  3 mL Intravenous Once   Continuous Infusions: . sodium chloride 75 mL/hr at 10/25/19 0712     LOS: 0 days    Time spent: 31 mins     Wyvonnia Dusky, MD [facility] Pager [phone]  If  7PM-7AM, please contact night-coverage [email or web address]  10/25/2019, 8:00 AM

## 2019-10-25 NOTE — Progress Notes (Signed)
PT Cancellation Note  Patient Details Name: DIA JEFFERYS MRN: [id number] DOB: 02-02-1971   Cancelled Treatment:    Reason Eval/Treat Not Completed: Patient at procedure or test/unavailable;Other (comment) (Patient consult received and reviewed, upon discussing with nurse found to be in process of transferring to the floor. Will attempt once settled in new room/as available.)  Precious Bard, PT, DPT   10/25/2019, 1:29 PM

## 2019-10-25 NOTE — ED Notes (Signed)
Tick found on pt's back by MD. Tick removed by this RN in one piece. No swelling present.

## 2019-10-25 NOTE — Consult Note (Signed)
Cardiology Consultation:   Patient ID: OMEGA SLAGER; [id number]; May 16, 1970   Admit date: 10/24/2019 Date of Consult: 10/25/2019  Primary Care Provider: No primary care provider on file. Primary Cardiologist: Debbe Odea, MD  Primary Electrophysiologist:  Dr. Graciela Husbands   Patient Profile:   Tyler [name] is a 49 y.o. male with a hx of acute systolic HF who is being seen today for the evaluation of chest pain at the request of Dr. Mayford Knife.  He would  History of Present Illness:   Tyler [name] presents with chest pain.   He has a history of cardiomyopathy and acute HF thought to be tachycardia related.  It presented after syncope.  The etiology of the cardiomyopathy was not entirely clear.  He did have atrial flutter.  He was seen by Dr. Graciela Husbands in January.  While there was flutter was not entirely clear that this was not a malignant arrhythmia and a LifeVest was suggested.  There was also discussion of flutter ablation.  However, he was a no show for his cardiology follow up.      He reports that he did not wear he is best either.  He apparently has not but did not wear it.  He has not had any further syncope or presyncope.  He never felt any palpitations.  He presented with chest pain.  He thought this might be similar to his previous episode but he did not have any syncope or presyncope.  He did not feel his heart racing.  He had a substernal discomfort.  It was 5 out of 10 in intensity.  There was no radiation to his arm or to his jaw but he felt short of breath and nauseated.  It persisted in the emergency room and he was given sublingual nitroglycerin.  His discomfort eventually eased.  EKG with sinus rhythm with a short PR interval.  CT did not demonstrate a PE.    CXR without edema.   Trop was negative.  He said this symptoms happened at rest after he was having an argument with his wife.  He is otherwise not had any symptoms and does not typically get chest discomfort, neck or arm  discomfort.  He has not been overly active however since his hospitalization.  Past Medical History:  Diagnosis Date  . Hypertension     Past Surgical History:  Procedure Laterality Date  . NO PAST SURGERIES       Home Medications:  Prior to Admission medications   Medication Sig Start Date End Date Taking? Authorizing Provider  Multiple Vitamin (MULTIVITAMIN WITH MINERALS) TABS tablet Take 1 tablet by mouth daily. 05/12/19  Yes Enedina Finner, MD  apixaban (ELIQUIS) 5 MG TABS tablet Take 1 tablet (5 mg total) by mouth 2 (two) times daily. 05/11/19   Enedina Finner, MD  losartan (COZAAR) 25 MG tablet Take 1 tablet (25 mg total) by mouth 2 (two) times daily. 05/11/19   Enedina Finner, MD  metoprolol succinate (TOPROL-XL) 50 MG 24 hr tablet Take 1 tablet (50 mg total) by mouth 2 times daily at 12 noon and 4 pm. Take with or immediately following a meal. 05/11/19   Enedina Finner, MD    Inpatient Medications: Scheduled Meds: . alum & mag hydroxide-simeth  30 mL Oral Once   And  . lidocaine  15 mL Oral Once  . apixaban  5 mg Oral BID  . aspirin EC  325 mg Oral Daily  . ipratropium-albuterol  3 mL Nebulization QID  .  losartan  25 mg Oral BID  . metoprolol succinate  50 mg Oral q12n4p  . multivitamin with minerals  1 tablet Oral Daily  . sodium chloride flush  3 mL Intravenous Once   Continuous Infusions: . sodium chloride 75 mL/hr at 10/25/19 4540   PRN Meds: acetaminophen, ALPRAZolam, hydrALAZINE, morphine injection, nitroGLYCERIN, ondansetron (ZOFRAN) IV, zolpidem  Allergies:   No Known Allergies  Social History:   Social History   Socioeconomic History  . Marital status: Married    Spouse name: Not on file  . Number of children: Not on file  . Years of education: Not on file  . Highest education level: Not on file  Occupational History  . Not on file  Tobacco Use  . Smoking status: Former Smoker    Packs/day: 1.00  . Smokeless tobacco: Never Used  Substance and Sexual Activity  .  Alcohol use: Yes    Comment: beer daily  . Drug use: Yes    Types: Methamphetamines  . Sexual activity: Not on file  Other Topics Concern  . Not on file  Social History Narrative   One daughter.     Social Determinants of Health   Financial Resource Strain:   . Difficulty of Paying Living Expenses:   Food Insecurity:   . Worried About Charity fundraiser in the Last Year:   . Arboriculturist in the Last Year:   Transportation Needs:   . Film/video editor (Medical):   Marland Kitchen Lack of Transportation (Non-Medical):   Physical Activity:   . Days of Exercise per Week:   . Minutes of Exercise per Session:   Stress:   . Feeling of Stress :   Social Connections:   . Frequency of Communication with Friends and Family:   . Frequency of Social Gatherings with Friends and Family:   . Attends Religious Services:   . Active Member of Clubs or Organizations:   . Attends Archivist Meetings:   Marland Kitchen Marital Status:   Intimate Partner Violence:   . Fear of Current or Ex-Partner:   . Emotionally Abused:   Marland Kitchen Physically Abused:   . Sexually Abused:     Family History:    Family History  Problem Relation Age of Onset  . Heart attack Mother 87  . Pancreatic cancer Father      ROS:  Please see the history of present illness.  ROS  All other ROS reviewed and negative.     Physical Exam/Data:   Vitals:   10/25/19 0927 10/25/19 0950 10/25/19 1100 10/25/19 1230  BP: (!) 155/108 (!) 158/101 (!) 153/107 (!) 157/94  Pulse: 78 83 77 68  Resp: 13 19  20   Temp:      TempSrc:      SpO2: 98% 98% 99% 97%  Weight:      Height:        Intake/Output Summary (Last 24 hours) at 10/25/2019 1341 Last data filed at 10/25/2019 1206 Gross per 24 hour  Intake --  Output 900 ml  Net -900 ml   Filed Weights   10/24/19 1933  Weight: 72.6 kg   Body mass index is 24.33 kg/m.  GENERAL:  Well appearing HEENT:   Pupils equal round and reactive, fundi not visualized, oral mucosa  unremarkable NECK:  No  jugular venous distention, waveform within normal limits, carotid upstroke brisk and symmetric, no bruits, no thyromegaly LYMPHATICS:  No cervical, inguinal adenopathy LUNGS:   Clear to auscultation bilaterally  BACK:  No CVA tenderness CHEST:   Unremarkable HEART:  PMI not displaced or sustained,S1 and S2 within normal limits, no S3, no S4, no clicks, no rubs, no murmurs ABD:  Flat, positive bowel sounds normal in frequency in pitch, no bruits, no rebound, no guarding, no midline pulsatile mass, no hepatomegaly, no splenomegaly EXT:  2 plus pulses throughout, no edema, no cyanosis no clubbing SKIN:  No rashes no nodules NEURO:   Cranial nerves II through XII grossly intact, motor grossly intact throughout PSYCH:    Cognitively intact, oriented to person place and time   EKG:  The EKG was personally reviewed and demonstrates:  Sinus tachycardia rate 112, non specific ST T wave changes.  Short PR interval.  Telemetry:  NA  Relevant CV Studies:  Echo:   1. Left ventricular ejection fraction, by visual estimation, is 25 to  30%. The left ventricle has severely decreased function. There is no left  ventricular hypertrophy.  2. Definity contrast agent was given IV to delineate the left ventricular  endocardial borders.  3. Indeterminate diastolic filling due to E-A fusion.  4. The left ventricle demonstrates global hypokinesis.  5. Global right ventricle has severely reduced systolic function.The  right ventricular size is normal. Right vetricular wall thickness was not  assessed.  6. Left atrial size was normal.  7. Right atrial size was normal.  8. The mitral valve is normal in structure. Trivial mitral valve  regurgitation.  9. The tricuspid valve is normal in structure.  10. The aortic valve is normal in structure. Aortic valve regurgitation is  not visualized.  11. The pulmonic valve was normal in structure. Pulmonic valve  regurgitation is not  visualized.  12. Normal pulmonary artery systolic pressure.  13. The inferior vena cava is normal in size with <50% respiratory  variability, suggesting right atrial pressure of 8 mmHg.   Laboratory Data:  Chemistry Recent Labs  Lab 10/24/19 1939  NA 138  K 3.8  CL 103  CO2 25  GLUCOSE 114*  BUN 14  CREATININE 0.96  CALCIUM 8.8*  GFRNONAA >60  GFRAA >60  ANIONGAP 10    No results for input(s): PROT, ALBUMIN, AST, ALT, ALKPHOS, BILITOT in the last 168 hours. Hematology Recent Labs  Lab 10/24/19 1939  WBC 7.8  RBC 4.56  HGB 14.2  HCT 41.7  MCV 91.4  MCH 31.1  MCHC 34.1  RDW 12.5  PLT 249   Cardiac EnzymesNo results for input(s): TROPONINI in the last 168 hours. No results for input(s): TROPIPOC in the last 168 hours.  BNPNo results for input(s): BNP, PROBNP in the last 168 hours.  DDimer No results for input(s): DDIMER in the last 168 hours.  Radiology/Studies:  DG Chest 2 View  Result Date: 10/24/2019 CLINICAL DATA:  Chest pain. Recent hospital admission and discharge from ICU with external pacemaker. Onset of chest pain and shortness of breath after drinking 2 energy drinks. EXAM: CHEST - 2 VIEW COMPARISON:  Radiograph 05/08/2019 FINDINGS: EKG leads overlie the chest.The cardiomediastinal contours are normal. Aortic atherosclerosis. Central bronchial thickening is chronic and likely smoking related. Streaky left lung base atelectasis. Pulmonary vasculature is normal. No consolidation, pleural effusion, or pneumothorax. No acute osseous abnormalities are seen. Remote right rib fracture unchanged. IMPRESSION: Streaky left lung base atelectasis. Aortic Atherosclerosis (ICD10-I70.0). Electronically Signed   By: Narda Rutherford M.D.   On: 10/24/2019 20:33   CT ANGIO CHEST PE W OR WO CONTRAST  Result Date: 10/24/2019 CLINICAL DATA:  49 year old male with shortness of breath and chest pain. EXAM: CT ANGIOGRAPHY CHEST WITH CONTRAST TECHNIQUE: Multidetector CT imaging of the  chest was performed using the standard protocol during bolus administration of intravenous contrast. Multiplanar CT image reconstructions and MIPs were obtained to evaluate the vascular anatomy. CONTRAST:  72mL OMNIPAQUE IOHEXOL 350 MG/ML SOLN COMPARISON:  Chest radiograph dated 10/24/2019. FINDINGS: Cardiovascular: There is no cardiomegaly or pericardial effusion. The thoracic aorta is unremarkable. The origins of the great vessels of the aortic arch appear patent. No CT evidence of pulmonary embolism. Mediastinum/Nodes: There is no hilar or mediastinal adenopathy. The esophagus and the thyroid gland are grossly unremarkable. No mediastinal fluid collection. Lungs/Pleura: Bibasilar linear atelectasis/scarring. No focal consolidation, pleural effusion, or pneumothorax. The central airways are patent. Upper Abdomen: Mild left adrenal thickening. The visualized upper abdomen is otherwise unremarkable. Musculoskeletal: No chest wall abnormality. No acute or significant osseous findings. Review of the MIP images confirms the above findings. IMPRESSION: No acute intrathoracic pathology. No CT evidence of pulmonary embolism. Electronically Signed   By: Elgie Collard M.D.   On: 10/24/2019 23:35    Assessment and Plan:   CHEST PAIN:    Chest pain is somewhat worrisome.  He does have significant cardiovascular risk factors.  It would be reasonable to screen him with coronary CTA.  ATRIAL FLUTTER:  Typical.  Tyler [name] has a CHA2DS2 - VASc score of 2.   Seen by Dr. Graciela Husbands.  There was a discussion about possible ablation.   He does take Eliquis.   He can remain on Eliquis.  I would continue the beta-blocker as listed.    ACUTE ON CHRONIC SYSTOLIC HF: Currently seems to be euvolemic.  He needs a repeat echocardiogram.    SYNCOPE:  This was of an unclear etiology.  Since it was a question of it being malignant Dr. Graciela Husbands is suggested a LifeVest and no driving for 6 months starting in January.   For  questions or updates, please contact [facility] Please consult [email or web address] for contact info under Cardiology/STEMI.   Signed, Rollene Rotunda, MD  10/25/2019 1:41 PM

## 2019-10-25 NOTE — Evaluation (Signed)
Physical Therapy Evaluation Patient Details Name: Tyler [name] MRN: [id number] DOB: 06/20/1970 Today's Date: 10/25/2019   History of Present Illness  Patient is a 49 year old male who presents to the Emergency Room with acute onset of L sided chest pain after having two energy drinks. Was treated by EMS with aspirin nitroglycerin.   PMH of HTN, a flutter, CHF, migraine,and cardiomyopathy (supposed to wear a heart vest), substance abuse, syncope.  Clinical Impression  Patient is a pleasant 49 year old male who presents with diffuse weakness and limited capacity for mobility due to fatigue. Patient lives in a one story home with one step to enter and family present. He does not have any assistive devices at home and is Ind at baseline. He currently is not working due to not being able to tolerate return to work with cardiac symptoms, spends his days playing with his dogs at home and being with his family.  Patient is in bed upon PT arrival and agreeable to evaluation. He is mod I with bed mobility and requires cueing for breathing between transitions for reduction of dizziness. STS and ambulation performed with CGA due to patient fatigue and limited strength for prolonged mobility. His gait mechanics deteriorated as he fatigued and patient was returned to bed, vitals remained within therapeutic range throughout session. Bed alarm set and patient's needs met. Patient will benefit from skilled physical therapy while hospitalized to increase strength, capacity for mobility, and stability. Upon discharge patient would benefit from outpatient physical therapy to address above mentioned deficits.     Follow Up Recommendations Outpatient PT    Equipment Recommendations  None recommended by PT    Recommendations for Other Services       Precautions / Restrictions Precautions Precautions: Other (comment) Precaution Comments: supposed to wear heart/life vest Restrictions Weight Bearing Restrictions: No       Mobility  Bed Mobility Overal bed mobility: Modified Independent             General bed mobility comments: supine to sit and sit to supine without assistance, HOB elevated.  Transfers Overall transfer level: Needs assistance Equipment used: None Transfers: Sit to/from Stand Sit to Stand: Min guard         General transfer comment: CGA due to patient feeling weak, no LOB or trunk sway  Ambulation/Gait Ambulation/Gait assistance: Min guard Gait Distance (Feet): 140 Feet Assistive device: None Gait Pattern/deviations: Decreased stride length;Step-through pattern;Narrow base of support     General Gait Details: gait is functional but patient is very fatigued with decreasing step length  Stairs            Wheelchair Mobility    Modified Rankin (Stroke Patients Only)       Balance Overall balance assessment: Needs assistance Sitting-balance support: No upper extremity supported;Feet supported Sitting balance-Leahy Scale: Good Sitting balance - Comments: able to reach inside/outside BOS without LOB   Standing balance support: No upper extremity supported;During functional activity Standing balance-Leahy Scale: Fair Standing balance comment: able to stand and ambulate without AD however stability reduces with fatigue.                             Pertinent Vitals/Pain Pain Assessment: 0-10 Pain Score: 2  Pain Location: dull ache all over body "feel like I've been hit by a truck" Pain Descriptors / Indicators: Aching Pain Intervention(s): Monitored during session;Repositioned    Home Living Family/patient expects to be discharged   to:: Private residence Living Arrangements: Spouse/significant other;Children Available Help at Discharge: Family Type of Home: House Home Access: Stairs to enter Entrance Stairs-Rails: None Entrance Stairs-Number of Steps: 1 Home Layout: One level Home Equipment: Crutches Additional Comments: Patient is  ind at baseline with no AD. Has crutches " somewhere in the attic"    Prior Function Level of Independence: Independent         Comments: Indep with ambulation and mobility. Has noticed weakness since January,was not able to return to work due to cardiac complications/exhaustion     Hand Dominance   Dominant Hand: Right    Extremity/Trunk Assessment   Upper Extremity Assessment Upper Extremity Assessment: Overall WFL for tasks assessed;Defer to OT evaluation    Lower Extremity Assessment Lower Extremity Assessment: Generalized weakness;RLE deficits/detail;LLE deficits/detail RLE Deficits / Details: grossly 4-/5 RLE Sensation: WNL RLE Coordination: WNL LLE Deficits / Details: hip grossly 4-/5 knee 3+/5 due to pain (old injury) LLE Sensation: WNL LLE Coordination: WNL       Communication   Communication: No difficulties  Cognition Arousal/Alertness: Awake/alert Behavior During Therapy: WFL for tasks assessed/performed Overall Cognitive Status: Within Functional Limits for tasks assessed                                 General Comments: A and O x4, eager to participate in therapy      General Comments General comments (skin integrity, edema, etc.): patient appears tired but well groomed and nourished    Exercises Other Exercises Other Exercises: Patient educated on role of PT in acute care setting, performed seated stability interventions with cues for breathing between transfer sequences for reduction of dizziness, prolonged ambulation with introduction of object negotiation without AD with CGA   Assessment/Plan    PT Assessment Patient needs continued PT services  PT Problem List Decreased strength;Decreased activity tolerance;Decreased mobility;Cardiopulmonary status limiting activity       PT Treatment Interventions DME instruction;Gait training;Stair training;Functional mobility training;Therapeutic activities;Patient/family  education;Neuromuscular re-education;Balance training;Therapeutic exercise    PT Goals (Current goals can be found in the Care Plan section)  Acute Rehab PT Goals Patient Stated Goal: to get my strength back PT Goal Formulation: With patient Time For Goal Achievement: 11/08/19 Potential to Achieve Goals: Fair    Frequency Min 2X/week   Barriers to discharge   would benefit from outpatient PT    Co-evaluation               AM-PAC PT "6 Clicks" Mobility  Outcome Measure Help needed turning from your back to your side while in a flat bed without using bedrails?: None Help needed moving from lying on your back to sitting on the side of a flat bed without using bedrails?: None Help needed moving to and from a bed to a chair (including a wheelchair)?: A Little Help needed standing up from a chair using your arms (e.g., wheelchair or bedside chair)?: A Little Help needed to walk in hospital room?: A Little Help needed climbing 3-5 steps with a railing? : A Little 6 Click Score: 20    End of Session Equipment Utilized During Treatment: Gait belt Activity Tolerance: Patient tolerated treatment well Patient left: in bed;with call bell/phone within reach;with bed alarm set Nurse Communication: Mobility status PT Visit Diagnosis: Unsteadiness on feet (R26.81);Other abnormalities of gait and mobility (R26.89);Muscle weakness (generalized) (M62.81)    Time: 0160-1093 PT Time Calculation (min) (ACUTE ONLY): 24 min  Charges:   PT Evaluation $PT Eval Low Complexity: 1 Low PT Treatments $Gait Training: 8-22 mins       Janna Arch, PT, DPT   10/25/2019, 3:31 PM

## 2019-10-25 NOTE — ED Notes (Signed)
Pt to room 37 by this RN. Pt's mother at bedside at this time. Pt visualized in NAD at this time. Call bell placed within reach. Pt placed on cardiac monitor. Pt noted to be in NSR at this time. Pt given urinal should need arise. Pt denies further needs at this time. Pt refused breakfast tray at this time.

## 2019-10-26 ENCOUNTER — Telehealth: Payer: Self-pay | Admitting: *Deleted

## 2019-10-26 ENCOUNTER — Encounter: Payer: Self-pay | Admitting: *Deleted

## 2019-10-26 ENCOUNTER — Observation Stay (HOSPITAL_BASED_OUTPATIENT_CLINIC_OR_DEPARTMENT_OTHER)
Admit: 2019-10-26 | Discharge: 2019-10-26 | Disposition: A | Discharge status: Home / Self Care | Payer: Self-pay | Attending: Cardiology | Admitting: Cardiology

## 2019-10-26 DIAGNOSIS — R072 Precordial pain: Secondary | ICD-10-CM

## 2019-10-26 DIAGNOSIS — R079 Chest pain, unspecified: Secondary | ICD-10-CM

## 2019-10-26 LAB — BASIC METABOLIC PANEL
Anion gap: 5 (ref 5–15)
BUN: 15 mg/dL (ref 6–20)
CO2: 26 mmol/L (ref 22–32)
Calcium: 8.7 mg/dL — ABNORMAL LOW (ref 8.9–10.3)
Chloride: 110 mmol/L (ref 98–111)
Creatinine, Ser: 0.87 mg/dL (ref 0.61–1.24)
GFR calc Af Amer: >60 mL/min (ref >60)
GFR calc non Af Amer: >60 mL/min (ref >60)
Glucose, Bld: 109 mg/dL — ABNORMAL HIGH (ref 70–99)
Potassium: 4.8 mmol/L (ref 3.5–5.1)
Sodium: 141 mmol/L (ref 135–145)

## 2019-10-26 LAB — CBC
HCT: 43.9 % (ref 39.0–52.0)
Hemoglobin: 15 g/dL (ref 13.0–17.0)
MCH: 31.6 pg (ref 26.0–34.0)
MCHC: 34.2 g/dL (ref 30.0–36.0)
MCV: 92.6 fL (ref 80.0–100.0)
Platelets: 225 10*3/uL (ref 150–400)
RBC: 4.74 MIL/uL (ref 4.22–5.81)
RDW: 12.4 % (ref 11.5–15.5)
WBC: 7 10*3/uL (ref 4.0–10.5)
nRBC: 0 % (ref 0.0–0.2)

## 2019-10-26 LAB — ECHOCARDIOGRAM COMPLETE
Height: 68 in
Weight: 2560 oz

## 2019-10-26 MED ORDER — METOPROLOL TARTRATE 100 MG PO TABS
ORAL_TABLET | ORAL | 0 refills | Status: DC
Start: 2019-10-26 — End: 2019-11-02

## 2019-10-26 NOTE — Progress Notes (Signed)
Progress Note  Patient Name: Tyler [name] Date of Encounter: 10/26/2019  Primary Cardiologist: Agbor-Etang  Subjective   No chest pain or dyspnea. Has a headache. Coronary CTA has been scheduled for 10/29/2019 at 9 AM.   Inpatient Medications    Scheduled Meds: . alum & mag hydroxide-simeth  30 mL Oral Once   And  . lidocaine  15 mL Oral Once  . apixaban  5 mg Oral BID  . aspirin EC  325 mg Oral Daily  . ipratropium-albuterol  3 mL Nebulization QID  . losartan  25 mg Oral BID  . metoprolol succinate  50 mg Oral q12n4p  . multivitamin with minerals  1 tablet Oral Daily  . sodium chloride flush  3 mL Intravenous Once   Continuous Infusions: . sodium chloride 75 mL/hr at 10/26/19 0939   PRN Meds: acetaminophen, ALPRAZolam, hydrALAZINE, morphine injection, nitroGLYCERIN, ondansetron (ZOFRAN) IV, zolpidem   Vital Signs    Vitals:   10/26/19 1154 10/26/19 1157 10/26/19 1513 10/26/19 1611  BP:  131/83  (!) 155/82  Pulse:  78  84  Resp:  18  18  Temp:  99.1 F (37.3 C)  97.8 F (36.6 C)  TempSrc:  Oral  Oral  SpO2: 99% 100% 98% 98%  Weight:      Height:        Intake/Output Summary (Last 24 hours) at 10/26/2019 1656 Last data filed at 10/26/2019 1500 Gross per 24 hour  Intake 2587.72 ml  Output 1600 ml  Net 987.72 ml   Filed Weights   10/24/19 1933  Weight: 72.6 kg    Telemetry    SR, rare PVC - Personally Reviewed  ECG    No new tracings - Personally Reviewed  Physical Exam   GEN: No acute distress.   Neck: No JVD. Cardiac: RRR, no murmurs, rubs, or gallops.  Respiratory: Clear to auscultation bilaterally.  GI: Soft, nontender, non-distended.   MS: No edema; No deformity. Neuro:  Alert and oriented x 3; Nonfocal.  Psych: Normal affect.  Labs    Chemistry Recent Labs  Lab 10/24/19 1939 10/26/19 0702  NA 138 141  K 3.8 4.8  CL 103 110  CO2 25 26  GLUCOSE 114* 109*  BUN 14 15  CREATININE 0.96 0.87  CALCIUM 8.8* 8.7*  GFRNONAA >60  >60  GFRAA >60 >60  ANIONGAP 10 5     Hematology Recent Labs  Lab 10/24/19 1939 10/26/19 0702  WBC 7.8 7.0  RBC 4.56 4.74  HGB 14.2 15.0  HCT 41.7 43.9  MCV 91.4 92.6  MCH 31.1 31.6  MCHC 34.1 34.2  RDW 12.5 12.4  PLT 249 225    Cardiac EnzymesNo results for input(s): TROPONINI in the last 168 hours. No results for input(s): TROPIPOC in the last 168 hours.   BNPNo results for input(s): BNP, PROBNP in the last 168 hours.   DDimer No results for input(s): DDIMER in the last 168 hours.   Radiology    DG Chest 2 View  Result Date: 10/24/2019 IMPRESSION: Streaky left lung base atelectasis. Aortic Atherosclerosis (ICD10-I70.0). Electronically Signed   By: Narda Rutherford M.D.   On: 10/24/2019 20:33   CT ANGIO CHEST PE W OR WO CONTRAST  Result Date: 10/24/2019 IMPRESSION: No acute intrathoracic pathology. No CT evidence of pulmonary embolism. Electronically Signed   By: Elgie Collard M.D.   On: 10/24/2019 23:35    Cardiac Studies   2D echo 10/26/2019: 1. Left ventricular ejection fraction,  by estimation, is 45 to 50%. The  left ventricle has mildly decreased function. The left ventricle has no  regional wall motion abnormalities. Left ventricular diastolic parameters  were normal.  2. Right ventricular systolic function is normal. The right ventricular  size is normal.  3. The mitral valve is normal in structure. Trivial mitral valve  regurgitation. No evidence of mitral stenosis.  4. The aortic valve is normal in structure. Aortic valve regurgitation is  not visualized. No aortic stenosis is present.  5. The inferior vena cava is normal in size with greater than 50%  respiratory variability, suggesting right atrial pressure of 3 mmHg. __________  2D echo 05/2019: 1. Left ventricular ejection fraction, by visual estimation, is 25 to  30%. The left ventricle has severely decreased function. There is no left  ventricular hypertrophy.  2. Definity contrast  agent was given IV to delineate the left ventricular  endocardial borders.  3. Indeterminate diastolic filling due to E-A fusion.  4. The left ventricle demonstrates global hypokinesis.  5. Global right ventricle has severely reduced systolic function.The  right ventricular size is normal. Right vetricular wall thickness was not  assessed.  6. Left atrial size was normal.  7. Right atrial size was normal.  8. The mitral valve is normal in structure. Trivial mitral valve  regurgitation.  9. The tricuspid valve is normal in structure.  10. The aortic valve is normal in structure. Aortic valve regurgitation is  not visualized.  11. The pulmonic valve was normal in structure. Pulmonic valve  regurgitation is not visualized.  12. Normal pulmonary artery systolic pressure.  13. The inferior vena cava is normal in size with <50% respiratory  variability, suggesting right atrial pressure of 8 mmHg.  Patient Profile     49 y.o. male with history of HFrEF, atrial flutter, syncope, and HTN who we are seeing for chest pain.  Assessment & Plan    1. Chest pain: -HS-Tn negative x 4 -Chest pain free -Continue with planned coronary CTA, 10/29/2019 at 9 AM (info placed on his discharge paperwork as well as printed and given to him). This was discussed in detail -Follow up with primary cardiologist   2. 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 Atrial flutter: -CHADS2VASc at least 2  -Evaluated by EP with possible discussion of ablation  -Eliquis  -Metoprolol   4. Syncope: -Unclear etiology -EP has recommended LifeVest given question of possible malignant arrhythmia  -No driving x 6 months starting in 05/2019 -Follow up with EP  5. Headache: -Discontinued and removed nitropaste   For questions or updates, please contact [place] Please consult [email or web address] for contact info under Cardiology/STEMI.    Signed, Christell Faith, PA-C [place] Pager: [phone] 10/26/2019, 4:56 PM

## 2019-10-26 NOTE — Telephone Encounter (Signed)
Secure chat sent to Eula Listen, PA stating this was scheduled.  The patient is still admitted and being followed by Cardiology. Per Alycia Rossetti, he will attach a copy of the Cardiac CTA instructions to the patient's discharge paperwork.   Metoprolol tartrate 100 mg sent in to the pharmacy to take 2 hours prior to his Cardiac CT x 1 dose per protocol.

## 2019-10-26 NOTE — Telephone Encounter (Signed)
Staff message received from Delrae Alfred (pre-cert coordinator) stating the patient's order was signed off in the que and ready for scheduling.

## 2019-10-26 NOTE — Telephone Encounter (Signed)
-----   Message from Lennon Alstrom, PA-C sent at 10/25/2019  9:15 PM EDT ----- Regarding: Cardiac CTA Order Hi There,   This patient was seen over the weekend at [facility] (6/20) and needs a cardiac CTA performed as an outpatient by our office this week if possible.   Indication is for chest pain.  Please send me the order to cosign once the patient is scheduled.   Please let me know if this cannot be scheduled this week.   Thank you so much for your help.   Sincerely,  Marisue Ivan

## 2019-10-26 NOTE — Telephone Encounter (Signed)
Message received from Cardiac CT scheduler, Jannet Askew stating the patient has been scheduled for his Cardiac CT on 10/29/19 at 9:00 am [facility]).   Your cardiac CT is scheduled at the location below:    [facility] [place], [place] [address] [phone]  Please arrive 15 mins early for check-in and test prep.  Please follow these instructions carefully (unless otherwise directed):  Hold all erectile dysfunction medications at least 3 days (72 hrs) prior to test.  On the Night Before the Test: . Be sure to Drink plenty of water. . Do not consume any caffeinated/decaffeinated beverages or chocolate 12 hours prior to your test. . Do not take any antihistamines 12 hours prior to your test  On the Day of the Test: . Drink plenty of water. Do not drink any water within one hour of the test. . Do not eat any food 4 hours prior to the test. . You may take your regular medications prior to the test.  . Take metoprolol (Lopressor) 100 mg two hours prior to test x 1 dose.        After the Test: . Drink plenty of water. . After receiving IV contrast, you may experience a mild flushed feeling. This is normal. . On occasion, you may experience a mild rash up to 24 hours after the test. This is not dangerous. If this occurs, you can take Benadryl 25 mg and increase your fluid intake. . If you experience trouble breathing, this can be serious. If it is severe call 911 IMMEDIATELY. If it is mild, please call our office.  For non-scheduling related questions, please contact the cardiac imaging nurse navigator should you have any questions/concerns: Marchia Bond, Cardiac Imaging Nurse Navigator Burley Saver, Interim Cardiac Imaging Nurse [place] and Vascular Services Direct Office Dial: [phone]   For scheduling needs, including cancellations and rescheduling, please call [phone].

## 2019-10-26 NOTE — Progress Notes (Signed)
*  PRELIMINARY RESULTS* Echocardiogram 2D Echocardiogram has been performed.  Tyler [name] Tyler [name] 10/26/2019, 10:13 AM

## 2019-10-26 NOTE — Addendum Note (Signed)
Addended by: Sherri Rad C on: 10/26/2019 04:45 PM   Modules accepted: Orders

## 2019-10-26 NOTE — Progress Notes (Signed)
Discharge instruction given all questions answered no distress noted.

## 2019-10-26 NOTE — Progress Notes (Signed)
PROGRESS NOTE    Tyler [name]  KZS:[id number] DOB: [birth year] DOA: 10/24/2019 PCP: No primary care provider on file.    Assessment & Plan:   Active Problems:   Chest pain   Chest pain: etiology unclear. Tenderness to palpation of chest on 10/25/19 and no chest today.Troponins are neg. Morphine, nitro prn. Continue on tele. CTA neg for PE. Awaiting further cardiac recs. Cardio following and recs apprec  PAF: continue on metoprolol & eliquis   Chronic systolic CHF: euvolemic. Echo shows EF 73-22%, normal diastolic function & no wall motion abnormalities.   Hypertension: continue on losartan and metoprolol.  Tobacco abuse: smoking cessation counseling    DVT prophylaxis: eliquis Code Status: full  Family Communication:  Disposition Plan: depends on cardio recs/tests/procedures. Awaiting further cardiac recs. Will d/c home.    Consultants:   Cardio    Procedures:    Antimicrobials   Subjective: Pt c/o fatigue  Objective: Vitals:   10/25/19 1946 10/25/19 2103 10/26/19 0445 10/26/19 0732  BP: 138/89  (!) 138/96 (!) 143/97  Pulse: 68  80 75  Resp: 15  18 17   Temp: 99 F (37.2 C)  97.9 F (36.6 C) 98.7 F (37.1 C)  TempSrc: Oral  Oral Oral  SpO2: 100% 100% 100% 100%  Weight:      Height:        Intake/Output Summary (Last 24 hours) at 10/26/2019 0839 Last data filed at 10/26/2019 0656 Gross per 24 hour  Intake 2017.38 ml  Output 1600 ml  Net 417.38 ml   Filed Weights   10/24/19 1933  Weight: 72.6 kg    Examination:  General exam: Appears calm and comfortable  Respiratory system: Clear to auscultation. Respiratory effort normal.  Cardiovascular system: S1 & S2+. No rubs, gallops or clicks. No pedal edema. Gastrointestinal system: Abdomen is nondistended, soft and nontender.  Normal bowel sounds heard. Central nervous system: Alert and oriented. Moves all 4 extremities  Psychiatry: Judgement and insight appear normal. Flat mood and affect.      Data Reviewed: I have personally reviewed following labs and imaging studies  CBC: Recent Labs  Lab 10/24/19 1939 10/26/19 0702  WBC 7.8 7.0  HGB 14.2 15.0  HCT 41.7 43.9  MCV 91.4 92.6  PLT 249 025   Basic Metabolic Panel: Recent Labs  Lab 10/24/19 1939 10/26/19 0702  NA 138 141  K 3.8 4.8  CL 103 110  CO2 25 26  GLUCOSE 114* 109*  BUN 14 15  CREATININE 0.96 0.87  CALCIUM 8.8* 8.7*   GFR: Estimated Creatinine Clearance: 101.6 mL/min (by C-G formula based on SCr of 0.87 mg/dL). Liver Function Tests: No results for input(s): AST, ALT, ALKPHOS, BILITOT, PROT, ALBUMIN in the last 168 hours. No results for input(s): LIPASE, AMYLASE in the last 168 hours. No results for input(s): AMMONIA in the last 168 hours. Coagulation Profile: Recent Labs  Lab 10/24/19 1939  INR 0.9   Cardiac Enzymes: No results for input(s): CKTOTAL, CKMB, CKMBINDEX, TROPONINI in the last 168 hours. BNP (last 3 results) No results for input(s): PROBNP in the last 8760 hours. HbA1C: No results for input(s): HGBA1C in the last 72 hours. CBG: No results for input(s): GLUCAP in the last 168 hours. Lipid Profile: Recent Labs    10/25/19 0636  CHOL 205*  HDL 77  LDLCALC 107*  TRIG 104  CHOLHDL 2.7   Thyroid Function Tests: No results for input(s): TSH, T4TOTAL, FREET4, T3FREE, THYROIDAB in the last 72 hours. Anemia Panel:  No results for input(s): VITAMINB12, FOLATE, FERRITIN, TIBC, IRON, RETICCTPCT in the last 72 hours. Sepsis Labs: No results for input(s): PROCALCITON, LATICACIDVEN in the last 168 hours.  Recent Results (from the past 240 hour(s))  SARS Coronavirus 2 by RT PCR (hospital order, performed in [facility] hospital lab) Nasopharyngeal Nasopharyngeal Swab     Status: None   Collection Time: 10/24/19  9:24 PM   Specimen: Nasopharyngeal Swab  Result Value Ref Range Status   SARS Coronavirus 2 NEGATIVE NEGATIVE Final    Comment: (NOTE) SARS-CoV-2 target nucleic acids  are NOT DETECTED.  The SARS-CoV-2 RNA is generally detectable in upper and lower respiratory specimens during the acute phase of infection. The lowest concentration of SARS-CoV-2 viral copies this assay can detect is 250 copies / mL. A negative result does not preclude SARS-CoV-2 infection and should not be used as the sole basis for treatment or other patient management decisions.  A negative result may occur with improper specimen collection / handling, submission of specimen other than nasopharyngeal swab, presence of viral mutation(s) within the areas targeted by this assay, and inadequate number of viral copies (<250 copies / mL). A negative result must be combined with clinical observations, patient history, and epidemiological information.  Fact Sheet for Patients:   BoilerBrush.com.cy  Fact Sheet for Healthcare Providers: https://pope.com/  This test is not yet approved or  cleared by the [place] FDA and has been authorized for detection and/or diagnosis of SARS-CoV-2 by FDA under an Emergency Use Authorization (EUA).  This EUA will remain in effect (meaning this test can be used) for the duration of the COVID-19 declaration under Section 564(b)(1) of the Act, 21 U.S.C. section 360bbb-3(b)(1), unless the authorization is terminated or revoked sooner.  Performed at [facility], [address] [place], [place] [address]          Radiology Studies: DG Chest 2 View  Result Date: 10/24/2019 CLINICAL DATA:  Chest pain. Recent hospital admission and discharge from ICU with external pacemaker. Onset of chest pain and shortness of breath after drinking 2 energy drinks. EXAM: CHEST - 2 VIEW COMPARISON:  Radiograph 05/08/2019 FINDINGS: EKG leads overlie the chest.The cardiomediastinal contours are normal. Aortic atherosclerosis. Central bronchial thickening is chronic and likely smoking related. Streaky left lung  base atelectasis. Pulmonary vasculature is normal. No consolidation, pleural effusion, or pneumothorax. No acute osseous abnormalities are seen. Remote right rib fracture unchanged. IMPRESSION: Streaky left lung base atelectasis. Aortic Atherosclerosis (ICD10-I70.0). Electronically Signed   By: Narda Rutherford M.D.   On: 10/24/2019 20:33   CT ANGIO CHEST PE W OR WO CONTRAST  Result Date: 10/24/2019 CLINICAL DATA:  49 year old male with shortness of breath and chest pain. EXAM: CT ANGIOGRAPHY CHEST WITH CONTRAST TECHNIQUE: Multidetector CT imaging of the chest was performed using the standard protocol during bolus administration of intravenous contrast. Multiplanar CT image reconstructions and MIPs were obtained to evaluate the vascular anatomy. CONTRAST:  84mL OMNIPAQUE IOHEXOL 350 MG/ML SOLN COMPARISON:  Chest radiograph dated 10/24/2019. FINDINGS: Cardiovascular: There is no cardiomegaly or pericardial effusion. The thoracic aorta is unremarkable. The origins of the great vessels of the aortic arch appear patent. No CT evidence of pulmonary embolism. Mediastinum/Nodes: There is no hilar or mediastinal adenopathy. The esophagus and the thyroid gland are grossly unremarkable. No mediastinal fluid collection. Lungs/Pleura: Bibasilar linear atelectasis/scarring. No focal consolidation, pleural effusion, or pneumothorax. The central airways are patent. Upper Abdomen: Mild left adrenal thickening. The visualized upper abdomen is otherwise unremarkable. Musculoskeletal:  No chest wall abnormality. No acute or significant osseous findings. Review of the MIP images confirms the above findings. IMPRESSION: No acute intrathoracic pathology. No CT evidence of pulmonary embolism. Electronically Signed   By: Elgie Collard M.D.   On: 10/24/2019 23:35        Scheduled Meds: . alum & mag hydroxide-simeth  30 mL Oral Once   And  . lidocaine  15 mL Oral Once  . apixaban  5 mg Oral BID  . aspirin EC  325 mg Oral  Daily  . ipratropium-albuterol  3 mL Nebulization QID  . losartan  25 mg Oral BID  . metoprolol succinate  50 mg Oral q12n4p  . multivitamin with minerals  1 tablet Oral Daily  . sodium chloride flush  3 mL Intravenous Once   Continuous Infusions: . sodium chloride 75 mL/hr at 10/26/19 0656     LOS: 0 days    Time spent: 30 mins     Charise Killian, MD [facility] Pager [phone]  If 7PM-7AM, please contact night-coverage [email or web address]  10/26/2019, 8:39 AM

## 2019-10-26 NOTE — Discharge Instructions (Signed)
    October 26, 2019   Tyler [name] [address] [place] [address]   Dear Tyler [name],  Your cardiac CT is scheduled at the location below:    [facility] [place], [place] [address] [phone]  Please arrive 15 mins early for check-in and test prep.  Please follow these instructions carefully (unless otherwise directed):  Hold all erectile dysfunction medications at least 3 days (72 hrs) prior to test.  On the Night Before the Test: . Be sure to Drink plenty of water. . Do not consume any caffeinated/decaffeinated beverages or chocolate 12 hours prior to your test. . Do not take any antihistamines 12 hours prior to your test  On the Day of the Test: . Drink plenty of water. Do not drink any water within one hour of the test. . Do not eat any food 4 hours prior to the test. . You may take your regular medications prior to the test.  . Take metoprolol (Lopressor) 100 mg two hours prior to test x 1 dose.        After the Test: . Drink plenty of water. . After receiving IV contrast, you may experience a mild flushed feeling. This is normal. . On occasion, you may experience a mild rash up to 24 hours after the test. This is not dangerous. If this occurs, you can take Benadryl 25 mg and increase your fluid intake. . If you experience trouble breathing, this can be serious. If it is severe call 911 IMMEDIATELY. If it is mild, please call our office.  For non-scheduling related questions, please contact the cardiac imaging nurse navigator should you have any questions/concerns: Marchia Bond, Cardiac Imaging Nurse Navigator Burley Saver, Interim Cardiac Imaging Nurse [place] and Vascular Services Direct Office Dial: [phone]   For scheduling needs, including cancellations and rescheduling, please call [phone].   If you have any questions or concerns, please don't hesitate to call.

## 2019-10-26 NOTE — Telephone Encounter (Signed)
STAT order placed for a Cardiac CTA to be done.  Staff message sent to precert/ Beacher May (CT scheduler) to see how quickly this can be precerted and scheduled.

## 2019-10-26 NOTE — Discharge Summary (Signed)
Physician Discharge Summary  Tyler [name]:[id number] DOB: [birth year] DOA: 10/24/2019  PCP: No primary care provider on file.  Admit date: 10/24/2019 Discharge date: 10/26/2019  Admitted From home Disposition: home  Recommendations for Outpatient Follow-up:  Follow up with PCP in 1 week CTA coronaries on 10/29/19 at 9AM  Home Health: no Equipment/Devices:  Discharge Condition: stable CODE STATUS: full  Diet recommendation: Heart Healthy  Brief/Interim Summary: HPI was take from Dr. Arville Care: Tyler [name]  is a 49 y.o. Caucasian male with a known history of hypertension, atrial flutter, systolic CHF and cardiomyopathy as well as migraine, who presented to the emergency room with acute onset of left sided chest pain felt as pressure with associated nausea without vomiting. He admitted to diaphoresis with this pain as well as dyspnea and palpitations. 90 cough or wheezing or hemoptysis. No leg pain or edema or recent travels or surgeries. No bleeding diathesis.  Conversation to the emergency room, blood pressure was 144/93 with a pulse of 112 and otherwise normal vital signs. Labs revealed unremarkable BMP and CBC. COVID-19 PCR came back negative. Two-view chest x-ray showed no acute cardiopulmonary disease. It did show streaky left lung base atelectasis and aortic atherosclerosis. EKG initially showed sinus tachycardia with a rate of 112 with nonspecific intraventricular conduction delay and to inversion laterally and later EKG after [place]ena nitroglycerin and Nitropaste revealed sinus rhythm with a rate of 78 with short PR interval and RVH with repolarization abnormality with slightly less T wave inversion laterally.  The patient was given 650 mg p.o. Tylenol and 1 inch of Nitropaste. He will be admitted to an observation progressive unit bed for further evaluation and management.  Hospital Course from Dr. Mayford Knife 6/20-6/21/21: Pt presented w/ atypical chest pain. The chest was tender to  palpation. Troponins were neg x 4. Cardio was consulted. Echo showed EF 45-50%, normal diastolic dysfunction, no regional wall motion abnormalities. Cardio ordered a CTA coronaries which cannot be done inpatient at [facility] and as a result was made an outpatient appt for 10/29/19 at 9AM. This was discussed in detailed w/ pt as per cardio. No further cardiac work up was done inpatient.     Discharge Diagnoses:  Active Problems:   Chest pain Chest pain: etiology unclear. Tenderness to palpation of chest on 10/25/19 and no chest today.Troponins are neg. Morphine, nitro prn. Continue on tele. CTA neg for PE. Awaiting further cardiac recs. Cardio following and recs apprec  PAF: continue on metoprolol & eliquis   Chronic systolic CHF: euvolemic. Echo shows EF 45-50%, normal diastolic function & no wall motion abnormalities.   Hypertension: continue on losartan and metoprolol.  Tobacco abuse: smoking cessation counseling     Discharge Instructions  Discharge Instructions    Diet - low sodium heart healthy   Complete by: As directed    Discharge instructions   Complete by: As directed    F/U PCP in 1 week. Continue with planned coronary CTA, 10/29/2019 at 9 AM (info placed on his discharge paperwork as well as printed and given to him). This was discussed in detail   Increase activity slowly   Complete by: As directed      Allergies as of 10/26/2019   No Known Allergies     Medication List    TAKE these medications   apixaban 5 MG Tabs tablet Commonly known as: ELIQUIS Take 1 tablet (5 mg total) by mouth 2 (two) times daily.   losartan 25 MG tablet Commonly known as: COZAAR Take  1 tablet (25 mg total) by mouth 2 (two) times daily.   metoprolol succinate 50 MG 24 hr tablet Commonly known as: TOPROL-XL Take 1 tablet (50 mg total) by mouth 2 times daily at 12 noon and 4 pm. Take with or immediately following a meal.   metoprolol tartrate 100 MG tablet Commonly known as:  LOPRESSOR Take 1 tablet (100 mg) by mouth 2 hours prior to your Cardiac CT x 1 dose.   multivitamin with minerals Tabs tablet Take 1 tablet by mouth daily.       No Known Allergies  Consultations: cardio  Procedures/Studies: DG Chest 2 View  Result Date: 10/24/2019 CLINICAL DATA:  Chest pain. Recent hospital admission and discharge from ICU with external pacemaker. Onset of chest pain and shortness of breath after drinking 2 energy drinks. EXAM: CHEST - 2 VIEW COMPARISON:  Radiograph 05/08/2019 FINDINGS: EKG leads overlie the chest.The cardiomediastinal contours are normal. Aortic atherosclerosis. Central bronchial thickening is chronic and likely smoking related. Streaky left lung base atelectasis. Pulmonary vasculature is normal. No consolidation, pleural effusion, or pneumothorax. No acute osseous abnormalities are seen. Remote right rib fracture unchanged. IMPRESSION: Streaky left lung base atelectasis. Aortic Atherosclerosis (ICD10-I70.0). Electronically Signed   By: Narda Rutherford M.D.   On: 10/24/2019 20:33   CT ANGIO CHEST PE W OR WO CONTRAST  Result Date: 10/24/2019 CLINICAL DATA:  49 year old male with shortness of breath and chest pain. EXAM: CT ANGIOGRAPHY CHEST WITH CONTRAST TECHNIQUE: Multidetector CT imaging of the chest was performed using the standard protocol during bolus administration of intravenous contrast. Multiplanar CT image reconstructions and MIPs were obtained to evaluate the vascular anatomy. CONTRAST:  75mL OMNIPAQUE IOHEXOL 350 MG/ML SOLN COMPARISON:  Chest radiograph dated 10/24/2019. FINDINGS: Cardiovascular: There is no cardiomegaly or pericardial effusion. The thoracic aorta is unremarkable. The origins of the great vessels of the aortic arch appear patent. No CT evidence of pulmonary embolism. Mediastinum/Nodes: There is no hilar or mediastinal adenopathy. The esophagus and the thyroid gland are grossly unremarkable. No mediastinal fluid collection.  Lungs/Pleura: Bibasilar linear atelectasis/scarring. No focal consolidation, pleural effusion, or pneumothorax. The central airways are patent. Upper Abdomen: Mild left adrenal thickening. The visualized upper abdomen is otherwise unremarkable. Musculoskeletal: No chest wall abnormality. No acute or significant osseous findings. Review of the MIP images confirms the above findings. IMPRESSION: No acute intrathoracic pathology. No CT evidence of pulmonary embolism. Electronically Signed   By: Elgie Collard M.D.   On: 10/24/2019 23:35   ECHOCARDIOGRAM COMPLETE  Result Date: 10/26/2019    ECHOCARDIOGRAM REPORT   Patient Name:   Tyler [name] Date of Exam: 10/26/2019 Medical Rec #:  [id number]     Height:       68.0 in Accession #:    [id number]    Weight:       160.0 lb Date of Birth:  01/23/1971     BSA:          1.859 m Patient Age:    49 years      BP:           143/97 mmHg Patient Gender: M             HR:           74 bpm. Exam Location:  [facility] Procedure: 2D Echo, Color Doppler and Cardiac Doppler Indications:     I42.9 Cardiomyopathy-unspecified  History:         Patient has prior history of Echocardiogram examinations, most  recent 05/08/2019. Risk Factors:Hypertension.  Sonographer:     Humphrey Rolls RDCS (AE) Referring Phys:  [address] [facility] Diagnosing Phys: Lorine Bears MD  Sonographer Comments: Suboptimal parasternal window. IMPRESSIONS  1. Left ventricular ejection fraction, by estimation, is 45 to 50%. The left ventricle has mildly decreased function. The left ventricle has no regional wall motion abnormalities. Left ventricular diastolic parameters were normal.  2. Right ventricular systolic function is normal. The right ventricular size is normal.  3. The mitral valve is normal in structure. Trivial mitral valve regurgitation. No evidence of mitral stenosis.  4. The aortic valve is normal in structure. Aortic valve regurgitation is not visualized. No aortic stenosis is present.  5.  The inferior vena cava is normal in size with greater than 50% respiratory variability, suggesting right atrial pressure of 3 mmHg. Comparison(s): A prior study was performed on 05/2019. Changes from prior study are noted. Significant improvment in EF. FINDINGS  Left Ventricle: Left ventricular ejection fraction, by estimation, is 45 to 50%. The left ventricle has mildly decreased function. The left ventricle has no regional wall motion abnormalities. The left ventricular internal cavity size was normal in size. There is no left ventricular hypertrophy. Left ventricular diastolic parameters were normal. Right Ventricle: The right ventricular size is normal. No increase in right ventricular wall thickness. Right ventricular systolic function is normal. Left Atrium: Left atrial size was normal in size. Right Atrium: Right atrial size was normal in size. Pericardium: There is no evidence of pericardial effusion. Mitral Valve: The mitral valve is normal in structure. Normal mobility of the mitral valve leaflets. Trivial mitral valve regurgitation. No evidence of mitral valve stenosis. MV peak gradient, 2.4 mmHg. The mean mitral valve gradient is 1.0 mmHg. Tricuspid Valve: The tricuspid valve is normal in structure. Tricuspid valve regurgitation is not demonstrated. No evidence of tricuspid stenosis. Aortic Valve: The aortic valve is normal in structure. Aortic valve regurgitation is not visualized. No aortic stenosis is present. Aortic valve mean gradient measures 3.0 mmHg. Aortic valve peak gradient measures 5.0 mmHg. Aortic valve area, by VTI measures 2.27 cm. Pulmonic Valve: The pulmonic valve was normal in structure. Pulmonic valve regurgitation is not visualized. No evidence of pulmonic stenosis. Aorta: The aortic root is normal in size and structure. Venous: The inferior vena cava is normal in size with greater than 50% respiratory variability, suggesting right atrial pressure of 3 mmHg. IAS/Shunts: No atrial  level shunt detected by color flow Doppler.  LEFT VENTRICLE PLAX 2D LVIDd:         4.58 cm  Diastology LVIDs:         3.50 cm  LV e' lateral:   9.57 cm/s LV PW:         0.92 cm  LV E/e' lateral: 7.3 LV IVS:        0.58 cm  LV e' medial:    9.46 cm/s LVOT diam:     2.00 cm  LV E/e' medial:  7.3 LV SV:         54 LV SV Index:   29 LVOT Area:     3.14 cm  RIGHT VENTRICLE RV Basal diam:  3.05 cm LEFT ATRIUM             Index       RIGHT ATRIUM           Index LA diam:        3.60 cm 1.94 cm/m  RA Area:     11.20  cm LA Vol (A2C):   33.0 ml 17.75 ml/m RA Volume:   24.10 ml  12.97 ml/m LA Vol (A4C):   49.1 ml 26.41 ml/m LA Biplane Vol: 42.9 ml 23.08 ml/m  AORTIC VALVE                   PULMONIC VALVE AV Area (Vmax):    2.25 cm    PV Vmax:       0.92 m/s AV Area (Vmean):   2.08 cm    PV Vmean:      62.800 cm/s AV Area (VTI):     2.27 cm    PV VTI:        0.187 m AV Vmax:           112.00 cm/s PV Peak grad:  3.4 mmHg AV Vmean:          81.900 cm/s PV Mean grad:  2.0 mmHg AV VTI:            0.238 m AV Peak Grad:      5.0 mmHg AV Mean Grad:      3.0 mmHg LVOT Vmax:         80.30 cm/s LVOT Vmean:        54.200 cm/s LVOT VTI:          0.172 m LVOT/AV VTI ratio: 0.72  AORTA Ao Root diam: 3.20 cm MITRAL VALVE MV Area (PHT): 3.56 cm    SHUNTS MV Peak grad:  2.4 mmHg    Systemic VTI:  0.17 m MV Mean grad:  1.0 mmHg    Systemic Diam: 2.00 cm MV Vmax:       0.77 m/s MV Vmean:      38.9 cm/s MV Decel Time: 213 msec MV E velocity: 69.40 cm/s MV A velocity: 65.60 cm/s MV E/A ratio:  1.06 Lorine Bears MD Electronically signed by Lorine Bears MD Signature Date/Time: 10/26/2019/12:39:28 PM    Final       Subjective: Pt denies any chest pain    Discharge Exam: Vitals:   10/26/19 1513 10/26/19 1611  BP:  (!) 155/82  Pulse:  84  Resp:  18  Temp:  97.8 F (36.6 C)  SpO2: 98% 98%   Vitals:   10/26/19 1154 10/26/19 1157 10/26/19 1513 10/26/19 1611  BP:  131/83  (!) 155/82  Pulse:  78  84  Resp:  18  18  Temp:   99.1 F (37.3 C)  97.8 F (36.6 C)  TempSrc:  Oral  Oral  SpO2: 99% 100% 98% 98%  Weight:      Height:         General exam: Appears calm and comfortable  Respiratory system: Clear to auscultation. Respiratory effort normal.  Cardiovascular system: S1 & S2+. No rubs, gallops or clicks. No pedal edema. Gastrointestinal system: Abdomen is nondistended, soft and nontender.  Normal bowel sounds heard. Central nervous system: Alert and oriented. Moves all 4 extremities  Psychiatry: Judgement and insight appear normal. Flat mood and affect.      The results of significant diagnostics from this hospitalization (including imaging, microbiology, ancillary and laboratory) are listed below for reference.     Microbiology: Recent Results (from the past 240 hour(s))  SARS Coronavirus 2 by RT PCR (hospital order, performed in [facility] hospital lab) Nasopharyngeal Nasopharyngeal Swab     Status: None   Collection Time: 10/24/19  9:24 PM   Specimen: Nasopharyngeal Swab  Result Value Ref Range Status  SARS Coronavirus 2 NEGATIVE NEGATIVE Final    Comment: (NOTE) SARS-CoV-2 target nucleic acids are NOT DETECTED.  The SARS-CoV-2 RNA is generally detectable in upper and lower respiratory specimens during the acute phase of infection. The lowest concentration of SARS-CoV-2 viral copies this assay can detect is 250 copies / mL. A negative result does not preclude SARS-CoV-2 infection and should not be used as the sole basis for treatment or other patient management decisions.  A negative result may occur with improper specimen collection / handling, submission of specimen other than nasopharyngeal swab, presence of viral mutation(s) within the areas targeted by this assay, and inadequate number of viral copies (<250 copies / mL). A negative result must be combined with clinical observations, patient history, and epidemiological information.  Fact Sheet for Patients:    BoilerBrush.com.cy  Fact Sheet for Healthcare Providers: https://pope.com/  This test is not yet approved or  cleared by the [place] FDA and has been authorized for detection and/or diagnosis of SARS-CoV-2 by FDA under an Emergency Use Authorization (EUA).  This EUA will remain in effect (meaning this test can be used) for the duration of the COVID-19 declaration under Section 564(b)(1) of the Act, 21 U.S.C. section 360bbb-3(b)(1), unless the authorization is terminated or revoked sooner.  Performed at [facility], [address] [place], [place] [address]      Labs: BNP (last 3 results) No results for input(s): BNP in the last 8760 hours. Basic Metabolic Panel: Recent Labs  Lab 10/24/19 1939 10/26/19 0702  NA 138 141  K 3.8 4.8  CL 103 110  CO2 25 26  GLUCOSE 114* 109*  BUN 14 15  CREATININE 0.96 0.87  CALCIUM 8.8* 8.7*   Liver Function Tests: No results for input(s): AST, ALT, ALKPHOS, BILITOT, PROT, ALBUMIN in the last 168 hours. No results for input(s): LIPASE, AMYLASE in the last 168 hours. No results for input(s): AMMONIA in the last 168 hours. CBC: Recent Labs  Lab 10/24/19 1939 10/26/19 0702  WBC 7.8 7.0  HGB 14.2 15.0  HCT 41.7 43.9  MCV 91.4 92.6  PLT 249 225   Cardiac Enzymes: No results for input(s): CKTOTAL, CKMB, CKMBINDEX, TROPONINI in the last 168 hours. BNP: Invalid input(s): POCBNP CBG: No results for input(s): GLUCAP in the last 168 hours. D-Dimer No results for input(s): DDIMER in the last 72 hours. Hgb A1c No results for input(s): HGBA1C in the last 72 hours. Lipid Profile Recent Labs    10/25/19 0636  CHOL 205*  HDL 77  LDLCALC 107*  TRIG 104  CHOLHDL 2.7   Thyroid function studies No results for input(s): TSH, T4TOTAL, T3FREE, THYROIDAB in the last 72 hours.  Invalid input(s): FREET3 Anemia work up No results for input(s): VITAMINB12, FOLATE, FERRITIN,  TIBC, IRON, RETICCTPCT in the last 72 hours. Urinalysis    Component Value Date/Time   COLORURINE YELLOW (A) 05/07/2019 2228   APPEARANCEUR CLEAR (A) 05/07/2019 2228   LABSPEC 1.012 05/07/2019 2228   PHURINE 6.0 05/07/2019 2228   GLUCOSEU NEGATIVE 05/07/2019 2228   HGBUR NEGATIVE 05/07/2019 2228   BILIRUBINUR NEGATIVE 05/07/2019 2228   KETONESUR NEGATIVE 05/07/2019 2228   PROTEINUR 30 (A) 05/07/2019 2228   NITRITE NEGATIVE 05/07/2019 2228   LEUKOCYTESUR NEGATIVE 05/07/2019 2228   Sepsis Labs Invalid input(s): PROCALCITONIN,  WBC,  LACTICIDVEN Microbiology Recent Results (from the past 240 hour(s))  SARS Coronavirus 2 by RT PCR (hospital order, performed in [facility] hospital lab) Nasopharyngeal Nasopharyngeal Swab  Status: None   Collection Time: 10/24/19  9:24 PM   Specimen: Nasopharyngeal Swab  Result Value Ref Range Status   SARS Coronavirus 2 NEGATIVE NEGATIVE Final    Comment: (NOTE) SARS-CoV-2 target nucleic acids are NOT DETECTED.  The SARS-CoV-2 RNA is generally detectable in upper and lower respiratory specimens during the acute phase of infection. The lowest concentration of SARS-CoV-2 viral copies this assay can detect is 250 copies / mL. A negative result does not preclude SARS-CoV-2 infection and should not be used as the sole basis for treatment or other patient management decisions.  A negative result may occur with improper specimen collection / handling, submission of specimen other than nasopharyngeal swab, presence of viral mutation(s) within the areas targeted by this assay, and inadequate number of viral copies (<250 copies / mL). A negative result must be combined with clinical observations, patient history, and epidemiological information.  Fact Sheet for Patients:   StrictlyIdeas.no  Fact Sheet for Healthcare Providers: BankingDealers.co.za  This test is not yet approved or  cleared by the [place] FDA and has been authorized for detection and/or diagnosis of SARS-CoV-2 by FDA under an Emergency Use Authorization (EUA).  This EUA will remain in effect (meaning this test can be used) for the duration of the COVID-19 declaration under Section 564(b)(1) of the Act, 21 U.S.C. section 360bbb-3(b)(1), unless the authorization is terminated or revoked sooner.  Performed at [facility], [address], [place], [place] [address]      Time coordinating discharge: Over 30 minutes  SIGNED:   Wyvonnia Dusky, MD  [facility] 10/26/2019, 6:36 PM Pager   If 7PM-7AM, please contact night-coverage [email or web address]

## 2019-10-27 ENCOUNTER — Telehealth (HOSPITAL_COMMUNITY): Payer: Self-pay | Admitting: *Deleted

## 2019-10-27 NOTE — Telephone Encounter (Signed)
Attempted to call patient regarding upcoming cardiac CT appointment. °Left message on voicemail with name and callback number ° °Aeryn Medici Tai RN Navigator Cardiac Imaging °[facility] °[phone] Office °[phone] Cell °

## 2019-10-28 ENCOUNTER — Telehealth: Payer: Self-pay | Admitting: *Deleted

## 2019-10-28 NOTE — Telephone Encounter (Signed)
-----   Message from Sondra Barges, PA-C sent at 10/26/2019  5:17 PM EDT ----- Scheduling: please get him in to see Dr. Azucena Cecil next week for follow up  Triage: please call 6/23 to go over coronary CTA ( I went over it with him during admission, but he will need this again)  Thanks!

## 2019-10-28 NOTE — Telephone Encounter (Signed)
Attempted to call the patient with his Cardiac CTA instructions. No answer- I left a message to please call back and advised we wanted to make sure he had picked up this RX (metoprolol 100 mg) that he was supposed to take 2 hours prior to his scan.   Will forward to scheduling to please call to arrange a follow up appointment next week with Dr. Garen Lah APP.     Your cardiac CT is scheduled at the location below:    [facility] [place], [place] [address] [phone]  Please arrive 15 mins early for check-in and test prep.  Please follow these instructions carefully (unless otherwise directed):  Hold all erectile dysfunction medications at least 3 days (72 hrs) prior to test.  On the Night Before the Test:  Be sure to Drink plenty of water.  Do not consume any caffeinated/decaffeinated beverages or chocolate 12 hours prior to your test.  Do not take any antihistamines 12 hours prior to your test  On the Day of the Test:  Drink plenty of water. Do not drink any water within one hour of the test.  Do not eat any food 4 hours prior to the test.  You may take your regular medications prior to the test.   Take metoprolol (Lopressor) 100 mg two hours prior to test x 1 dose.        After the Test:  Drink plenty of water.  After receiving IV contrast, you may experience a mild flushed feeling. This is normal.  On occasion, you may experience a mild rash up to 24 hours after the test. This is not dangerous. If this occurs, you can take Benadryl 25 mg and increase your fluid intake.  If you experience trouble breathing, this can be serious. If it is severe call [phone] IMMEDIATELY. If it is mild, please call our office.  For non-scheduling related questions, please contact the cardiac imaging nurse navigator should you have any questions/concerns: Marchia Bond, Cardiac Imaging Nurse Navigator Burley Saver,  Interim Cardiac Imaging Nurse [facility] Direct Office Dial: [phone]   For scheduling needs, including cancellations and rescheduling, please call [phone].

## 2019-10-29 ENCOUNTER — Other Ambulatory Visit: Payer: Self-pay

## 2019-10-29 ENCOUNTER — Ambulatory Visit
Admission: RE | Admit: 2019-10-29 | Discharge: 2019-10-29 | Disposition: A | Discharge status: Home / Self Care | Payer: Self-pay | Source: Ambulatory Visit | Attending: Physician Assistant | Admitting: Physician Assistant

## 2019-10-29 DIAGNOSIS — R072 Precordial pain: Secondary | ICD-10-CM

## 2019-10-29 MED ORDER — METOPROLOL TARTRATE 5 MG/5ML IV SOLN
5.0000 mg | Freq: Once | INTRAVENOUS | Status: AC
  Administered 2019-10-29: 5 mg via INTRAVENOUS

## 2019-10-29 MED ORDER — NITROGLYCERIN 0.4 MG SL SUBL
0.8000 mg | SUBLINGUAL_TABLET | Freq: Once | SUBLINGUAL | Status: AC
  Administered 2019-10-29: 0.8 mg via SUBLINGUAL

## 2019-10-29 MED ORDER — METOPROLOL TARTRATE 5 MG/5ML IV SOLN
10.0000 mg | Freq: Once | INTRAVENOUS | Status: AC
  Administered 2019-10-29: 10 mg via INTRAVENOUS

## 2019-10-29 MED ORDER — IOHEXOL 350 MG/ML SOLN
75.0000 mL | Freq: Once | INTRAVENOUS | Status: AC | PRN
  Administered 2019-10-29: 75 mL via INTRAVENOUS

## 2019-10-29 NOTE — Progress Notes (Signed)
Ambulate to chair without difficulty. Talking with staff and drinking coffee, declined food. VSS. All questions answered. No further needs.

## 2019-10-29 NOTE — Telephone Encounter (Signed)
A vm was left for patient to call and schedule fu next week

## 2019-10-30 ENCOUNTER — Telehealth: Payer: Self-pay | Admitting: Family

## 2019-10-30 NOTE — Telephone Encounter (Signed)
Second call attempted 10/30/19 at 1420. No answer. Did not leave voicemail as voicemail previously left.  Alver Sorrow, NP

## 2019-10-30 NOTE — Telephone Encounter (Signed)
Called number on phone 10/30/19 at 1430 and left voicemail to ask for prompt return call regarding cardiac CT results. Will continue to attempt to reach him.  Per message from Leafy Kindle, PA she was notified by Dr. Mayford Knife that the patient needs called and discussion to assess for resting anginal symptoms. If resting anginal symptoms present, needs admission over the weekend.   Cardiac CT showed high grade lesion of prox LAD and possibly in RCA and Cx. Sent for stat FFR per Dr. Mayford Knife.   Alver Sorrow, NP  ----------  Cardiac CTA 10/30/19 showed:   IMPRESSION: 1. Coronary calcium score of 723. This was 99th percentile for age and sex matched control.  2.  Normal coronary origin with right dominance.  3. Severe atherosclerosis of the LAD. CAD-RADS=4a. This may be overestimated due to significant blooming artifact. Breathing and cardiac motion artifact limits quality of study.  4. Consider symptom-guided anti-ischemic and preventive pharmacotherapy as well as risk factor modification per guideline-directed care.  5. Other treatments (including options of revascularization) should be considered per guideline-directed care.  6.  This study has been submitted for FFR flow analysis.

## 2019-10-31 ENCOUNTER — Telehealth: Payer: Self-pay | Admitting: Physician Assistant

## 2019-10-31 ENCOUNTER — Encounter: Payer: Self-pay | Admitting: Physician Assistant

## 2019-10-31 ENCOUNTER — Other Ambulatory Visit: Payer: Self-pay | Admitting: Physician Assistant

## 2019-10-31 DIAGNOSIS — I200 Unstable angina: Secondary | ICD-10-CM

## 2019-10-31 NOTE — Telephone Encounter (Addendum)
   I received a phone call from reading physician regarding the patient's coronary CTA and abnormal FFR involving the LAD and RCA territories.  Efforts to reach the patient by our office on 6/25 were unsuccessful.  After multiple phone calls to the listed patient number, his mother's number, and his daughter's number I was able to get in touch with the patient.  He continues to note angina and in particular at rest this morning.  He feels like his symptoms are about the same to slightly worse.  Given this, I have recommended that he come in through the ED for cardiac enzymes cycling and admission with plans for diagnostic LHC on 6/28.  Should the patient become unstable this can certainly be done over the weekend as well.  Patient and wife were agreeable to this treatment plan.  I have called and spoken with the ED charge nurse for signout.  I will go ahead and sign/hold cardiac cath orders and notify the patient's wife of his approximate case time on Monday.  Rounding cardiology MD is also aware that the patient will be admitted today and need cardiac consultation.  He has been advised to hold Eliquis and will need heparin gtt upon admission for underlying atrial flutter.

## 2019-11-01 ENCOUNTER — Observation Stay
Admission: EM | Admit: 2019-11-01 | Discharge: 2019-11-02 | Disposition: A | Discharge status: Home / Self Care | Payer: Medicaid Other | Attending: Internal Medicine | Admitting: Internal Medicine

## 2019-11-01 ENCOUNTER — Emergency Department: Payer: Medicaid Other

## 2019-11-01 ENCOUNTER — Encounter: Payer: Self-pay | Admitting: Emergency Medicine

## 2019-11-01 ENCOUNTER — Other Ambulatory Visit: Payer: Self-pay

## 2019-11-01 DIAGNOSIS — Z20822 Contact with and (suspected) exposure to covid-19: Secondary | ICD-10-CM | POA: Insufficient documentation

## 2019-11-01 DIAGNOSIS — I4892 Unspecified atrial flutter: Secondary | ICD-10-CM | POA: Diagnosis present

## 2019-11-01 DIAGNOSIS — E785 Hyperlipidemia, unspecified: Secondary | ICD-10-CM | POA: Insufficient documentation

## 2019-11-01 DIAGNOSIS — I110 Hypertensive heart disease with heart failure: Secondary | ICD-10-CM | POA: Insufficient documentation

## 2019-11-01 DIAGNOSIS — I200 Unstable angina: Secondary | ICD-10-CM | POA: Insufficient documentation

## 2019-11-01 DIAGNOSIS — I5022 Chronic systolic (congestive) heart failure: Secondary | ICD-10-CM | POA: Insufficient documentation

## 2019-11-01 DIAGNOSIS — I5020 Unspecified systolic (congestive) heart failure: Secondary | ICD-10-CM | POA: Diagnosis present

## 2019-11-01 DIAGNOSIS — R079 Chest pain, unspecified: Secondary | ICD-10-CM | POA: Diagnosis present

## 2019-11-01 DIAGNOSIS — Z7901 Long term (current) use of anticoagulants: Secondary | ICD-10-CM | POA: Insufficient documentation

## 2019-11-01 DIAGNOSIS — Z87891 Personal history of nicotine dependence: Secondary | ICD-10-CM | POA: Insufficient documentation

## 2019-11-01 DIAGNOSIS — T391X1A Poisoning by 4-Aminophenol derivatives, accidental (unintentional), initial encounter: Secondary | ICD-10-CM

## 2019-11-01 DIAGNOSIS — I429 Cardiomyopathy, unspecified: Secondary | ICD-10-CM | POA: Insufficient documentation

## 2019-11-01 DIAGNOSIS — Z79899 Other long term (current) drug therapy: Secondary | ICD-10-CM | POA: Insufficient documentation

## 2019-11-01 DIAGNOSIS — I25110 Atherosclerotic heart disease of native coronary artery with unstable angina pectoris: Principal | ICD-10-CM | POA: Insufficient documentation

## 2019-11-01 HISTORY — DX: Other psychoactive substance abuse, uncomplicated: F19.10

## 2019-11-01 HISTORY — DX: Unspecified atrial flutter: I48.92

## 2019-11-01 HISTORY — DX: Unspecified systolic (congestive) heart failure: I50.20

## 2019-11-01 HISTORY — DX: Syncope and collapse: R55

## 2019-11-01 HISTORY — DX: Atherosclerotic heart disease of native coronary artery without angina pectoris: I25.10

## 2019-11-01 LAB — CBC
HCT: 42.1 % (ref 39.0–52.0)
Hemoglobin: 14.7 g/dL (ref 13.0–17.0)
MCH: 31.1 pg (ref 26.0–34.0)
MCHC: 34.9 g/dL (ref 30.0–36.0)
MCV: 89.2 fL (ref 80.0–100.0)
Platelets: 271 10*3/uL (ref 150–400)
RBC: 4.72 MIL/uL (ref 4.22–5.81)
RDW: 12.5 % (ref 11.5–15.5)
WBC: 6.4 10*3/uL (ref 4.0–10.5)
nRBC: 0 % (ref 0.0–0.2)

## 2019-11-01 LAB — TROPONIN I (HIGH SENSITIVITY)
Troponin I (High Sensitivity): 8 ng/L (ref <18)
Troponin I (High Sensitivity): 7 ng/L (ref <18)

## 2019-11-01 LAB — BASIC METABOLIC PANEL
Anion gap: 10 (ref 5–15)
BUN: 9 mg/dL (ref 6–20)
CO2: 26 mmol/L (ref 22–32)
Calcium: 8.7 mg/dL — ABNORMAL LOW (ref 8.9–10.3)
Chloride: 105 mmol/L (ref 98–111)
Creatinine, Ser: 1 mg/dL (ref 0.61–1.24)
GFR calc Af Amer: >60 mL/min (ref >60)
GFR calc non Af Amer: >60 mL/min (ref >60)
Glucose, Bld: 129 mg/dL — ABNORMAL HIGH (ref 70–99)
Potassium: 4 mmol/L (ref 3.5–5.1)
Sodium: 141 mmol/L (ref 135–145)

## 2019-11-01 LAB — ACETAMINOPHEN LEVEL: Acetaminophen (Tylenol), Serum: 49 ug/mL — ABNORMAL HIGH (ref 10–30)

## 2019-11-01 LAB — SALICYLATE LEVEL: Salicylate Lvl: <7 mg/dL — ABNORMAL LOW (ref 7.0–30.0)

## 2019-11-01 MED ORDER — NITROGLYCERIN 2 % TD OINT
1.0000 [in_us] | TOPICAL_OINTMENT | Freq: Once | TRANSDERMAL | Status: AC
  Administered 2019-11-02: 1 [in_us] via TOPICAL
  Filled 2019-11-01: qty 1

## 2019-11-01 MED ORDER — ASPIRIN 81 MG PO CHEW
324.0000 mg | CHEWABLE_TABLET | Freq: Once | ORAL | Status: AC
  Administered 2019-11-02: 324 mg via ORAL
  Filled 2019-11-01: qty 4

## 2019-11-01 MED ORDER — HEPARIN SODIUM (PORCINE) 5000 UNIT/ML IJ SOLN
4000.0000 [IU] | Freq: Once | INTRAMUSCULAR | Status: AC
  Administered 2019-11-02: 4000 [IU] via INTRAVENOUS
  Filled 2019-11-01: qty 1

## 2019-11-01 MED ORDER — HEPARIN (PORCINE) 25000 UT/250ML-% IV SOLN
1050.0000 [IU]/h | INTRAVENOUS | Status: DC
  Administered 2019-11-02: 1050 [IU]/h via INTRAVENOUS
  Filled 2019-11-01: qty 250

## 2019-11-01 NOTE — ED Notes (Signed)
Pt reports chest pain is worse now 6/10 and feels like his heart is beating out of his chest. Repeat EKG done and troponin drawn.  Pt also reports taking 1 tylenol this morning and 3 around 1pm.  Pt denies chronic tylenol use.

## 2019-11-01 NOTE — ED Triage Notes (Signed)
Pt arrived via POV with family member states he has been having chest pain since this morning. Pt has been seen by Cardiology this week and has scheduled heart cath tomorrow.  Pt states the chest pain started this morning.  Pt reports to taking #1 Tylenol this morning and #3 tylenol tabs this afternoon for the pain.  Pt was brought in by step-father, who stated to the First Nurse the patient's wife reported to the step-father that the patient took a 50 count bottle of Tylenol PM that has 500mg  tylenol and 25mg  of benadryl in each tab.  Pt did not inform staff of this, however, the step-father did tell the first nurse the patient and his wife are fighting.  Pt is alert and oriented at this time, does not appear to be drowsy, speech is clear, gait is steady.

## 2019-11-01 NOTE — ED Notes (Signed)
D/w Dr. Mayford Knife,  New orders received for Tylenol level and salicylate level

## 2019-11-01 NOTE — ED Provider Notes (Signed)
[facility] Emergency Department Provider Note   ____________________________________________   First MD Initiated Contact with Patient 11/01/19 2330     (approximate)  I have reviewed the triage vital signs and the nursing notes.   HISTORY  Chief Complaint Chest Pain    HPI Tyler [name] is a 49 y.o. male who presents to the ED from home with a chief complaint of chest pain.  Patient has been seeing cardiology for chest pain.  He was called in the afternoon by the cardiology PA regarding his coronary CTA and abnormal FFR involving the LAD and RCA territories.  Cardiology recommended that he come to the ED for cardiac enzymes cycling and admissions with plans for cardiac cath tomorrow 6/28.  He was instructed to hold his Eliquis and note states he will need heparin GTT upon admission for underlying atrial flutter (note visible in computer read by me).  Patient reports left-sided chest pressure beginning yesterday afternoon.  Associated diaphoresis, shortness of breath, nausea/vomiting.  Incidentally, patient also arguing with his wife who states he took an entire bottle of 50 Tylenol/Benadryl tablets.  Patient denies this.  He denies SI/HI/AH/VH.  States he took 4 total Tylenol yesterday for headache.  Last took 3 Tylenol around 1:30pm.   Denies associated fever, cough, abdominal pain, dysuria or diarrhea.  Denies recent travel or trauma.  Denies COVID-19 exposure.        Past Medical History:  Diagnosis Date  . Hypertension     Patient Active Problem List   Diagnosis Date Noted  .  Possible Acetaminophen toxicity 11/02/2019  . Chest pain 10/24/2019  . HFrEF (heart failure with reduced ejection fraction) (HCC)   . Substance abuse (HCC)   . Cardiomyopathy (HCC)   . Syncope and collapse 05/07/2019  . Atrial flutter (HCC)   . Essential hypertension     Past Surgical History:  Procedure Laterality Date  . NO PAST SURGERIES      Prior to  Admission medications   Medication Sig Start Date End Date Taking? Authorizing Provider  apixaban (ELIQUIS) 5 MG TABS tablet Take 1 tablet (5 mg total) by mouth 2 (two) times daily. 05/11/19   Enedina Finner, MD  losartan (COZAAR) 25 MG tablet Take 1 tablet (25 mg total) by mouth 2 (two) times daily. 05/11/19   Enedina Finner, MD  metoprolol succinate (TOPROL-XL) 50 MG 24 hr tablet Take 1 tablet (50 mg total) by mouth 2 times daily at 12 noon and 4 pm. Take with or immediately following a meal. 05/11/19   Enedina Finner, MD  metoprolol tartrate (LOPRESSOR) 100 MG tablet Take 1 tablet (100 mg) by mouth 2 hours prior to your Cardiac CT x 1 dose. 10/26/19   Marisue Ivan D, PA-C  Multiple Vitamin (MULTIVITAMIN WITH MINERALS) TABS tablet Take 1 tablet by mouth daily. 05/12/19   Enedina Finner, MD    Allergies Patient has no known allergies.  Family History  Problem Relation Age of Onset  . Heart attack Mother 46  . Pancreatic cancer Father     Social History Social History   Tobacco Use  . Smoking status: Former Smoker    Packs/day: 1.00  . Smokeless tobacco: Never Used  Substance Use Topics  . Alcohol use: Yes    Comment: beer daily  . Drug use: Yes    Types: Methamphetamines, Marijuana    Review of Systems  Constitutional: No fever/chills Eyes: No visual changes. ENT: No sore throat. Cardiovascular: Positive for chest pain.  Respiratory: Positive for shortness of breath. Gastrointestinal: No abdominal pain.  No nausea, no vomiting.  No diarrhea.  No constipation. Genitourinary: Negative for dysuria. Musculoskeletal: Negative for back pain. Skin: Negative for rash. Neurological: Negative for headaches, focal weakness or numbness.   ____________________________________________   PHYSICAL EXAM:  VITAL SIGNS: ED Triage Vitals  Enc Vitals Group     BP 11/01/19 2044 (!) 172/100     Pulse Rate 11/01/19 2044 77     Resp 11/01/19 2044 18     Temp 11/01/19 2044 98.2 F (36.8 C)     Temp  Source 11/01/19 2044 Oral     SpO2 11/01/19 2044 99 %     Weight 11/01/19 2040 169 lb (76.7 kg)     Height 11/01/19 2040 5\' 8"  (1.727 m)     Head Circumference --      Peak Flow --      Pain Score 11/01/19 2041 5     Pain Loc --      Pain Edu? --      Excl. in [place]? --     Constitutional: Alert and oriented. Well appearing and in no acute distress. Eyes: Conjunctivae are normal. PERRL. EOMI. Head: Atraumatic. Nose: No congestion/rhinnorhea. Mouth/Throat: Mucous membranes are moist.   Neck: No stridor.   Cardiovascular: Normal rate, regular rhythm. Grossly normal heart sounds.  Good peripheral circulation. Respiratory: Normal respiratory effort.  No retractions. Lungs CTAB. Gastrointestinal: Soft and nontender to light or deep palpation. No distention. No abdominal bruits. No CVA tenderness. Musculoskeletal: No lower extremity tenderness nor edema.  No joint effusions. Neurologic:  Normal speech and language. No gross focal neurologic deficits are appreciated. No gait instability. Skin:  Skin is warm, dry and intact. No rash noted. Psychiatric: Mood and affect are normal. Speech and behavior are normal.  ____________________________________________   LABS (all labs ordered are listed, but only abnormal results are displayed)  Labs Reviewed  BASIC METABOLIC PANEL - Abnormal; Notable for the following components:      Result Value   Glucose, Bld 129 (*)    Calcium 8.7 (*)    All other components within normal limits  ACETAMINOPHEN LEVEL - Abnormal; Notable for the following components:   Acetaminophen (Tylenol), Serum 49 (*)    All other components within normal limits  SALICYLATE LEVEL - Abnormal; Notable for the following components:   Salicylate Lvl <5.6 (*)    All other components within normal limits  HEPATIC FUNCTION PANEL - Abnormal; Notable for the following components:   Total Protein 6.2 (*)    All other components within normal limits  ACETAMINOPHEN LEVEL -  Abnormal; Notable for the following components:   Acetaminophen (Tylenol), Serum 68 (*)    All other components within normal limits  SARS CORONAVIRUS 2 BY RT PCR (HOSPITAL ORDER, [place] [facility])  CBC  APTT  PROTIME-INR  ETHANOL  LIPASE, BLOOD  APTT  HEPATIC FUNCTION PANEL  BASIC METABOLIC PANEL  LIPID PANEL  CBC  ACETAMINOPHEN LEVEL  TROPONIN I (HIGH SENSITIVITY)  TROPONIN I (HIGH SENSITIVITY)   ____________________________________________  EKG  ED ECG REPORT I, Jolean Madariaga J, the attending physician, personally viewed and interpreted this ECG.   Date: 11/01/2019  EKG Time: 2027  Rate: 77  Rhythm: normal EKG, normal sinus rhythm  Axis: Normal  Intervals:none  ST&T Change: Nonspecific  ____________________________________________  RADIOLOGY  ED MD interpretation: No acute cardiopulmonary process  Official radiology report(s): DG Chest 2 View  Result Date: 11/01/2019  CLINICAL DATA:  Chest pain EXAM: CHEST - 2 VIEW COMPARISON:  10/24/2019 FINDINGS: Nodular densities project over both lower lungs, felt represent nipple shadows. Heart is normal size. No confluent opacities or effusions. No acute bony abnormality. Old healed right lateral 7th rib fracture. IMPRESSION: No acute cardiopulmonary disease. Electronically Signed   By: Charlett Nose M.D.   On: 11/01/2019 21:24    ____________________________________________   PROCEDURES  Procedure(s) performed (including Critical Care):  .1-3 Lead EKG Interpretation Performed by: Irean Hong, MD Authorized by: Irean Hong, MD     Interpretation: normal     ECG rate:  75   ECG rate assessment: normal     Rhythm: sinus rhythm     Ectopy: none     Conduction: normal   Comments:     Patient placed on cardiac monitor to evaluate for arrhythmias     ____________________________________________   INITIAL IMPRESSION / ASSESSMENT AND PLAN / ED COURSE  As part of my medical decision making, I  reviewed the following data within the electronic MEDICAL RECORD NUMBER Nursing notes reviewed and incorporated, Labs reviewed, EKG interpreted, Old chart reviewed, Radiograph reviewed, Discussed with admitting physician and Notes from prior ED visits     PRANEEL HAISLEY was evaluated in Emergency Department on 11/02/2019 for the symptoms described in the history of present illness. He was evaluated in the context of the global COVID-19 pandemic, which necessitated consideration that the patient might be at risk for infection with the SARS-CoV-2 virus that causes COVID-19. Institutional protocols and algorithms that pertain to the evaluation of patients at risk for COVID-19 are in a state of rapid change based on information released by regulatory bodies including the CDC and federal and state organizations. These policies and algorithms were followed during the patient's care in the ED.    49 year old male presenting with chest pain/unstable angina.  Scheduled for [facility] tomorrow. Differential diagnosis includes, but is not limited to, ACS, aortic dissection, pulmonary embolism, cardiac tamponade, pneumothorax, pneumonia, pericarditis, myocarditis, GI-related causes including esophagitis/gastritis, and musculoskeletal chest wall pain.    Incidental history of possible Tylenol overdose.  Initial Tylenol level at 2049 is 49.  Will consult poison control and repeat.  Will check LFTs/lipase.  Initiate heparin GTT per cardiology note.  Will discuss with hospitalist services for admission.  Clinical Course as of Nov 02 519  Mon Nov 02, 2019  0105 Poison control recommends supportive care; no [employer] if patient's timeline is to be believed.   [JS]    Clinical Course User Index [JS] Irean Hong, MD     ____________________________________________   FINAL CLINICAL IMPRESSION(S) / ED DIAGNOSES  Final diagnoses:  Unstable angina [facility])     ED Discharge Orders    None       Note:  This document was  prepared using Dragon voice recognition software and may include unintentional dictation errors.   Irean Hong, MD 11/02/19 [phone]

## 2019-11-02 ENCOUNTER — Other Ambulatory Visit: Payer: Self-pay

## 2019-11-02 ENCOUNTER — Encounter: Payer: Self-pay | Admitting: Internal Medicine

## 2019-11-02 ENCOUNTER — Encounter
Admission: EM | Disposition: A | Discharge status: Home / Self Care | Payer: Self-pay | Source: Home / Self Care | Attending: Emergency Medicine

## 2019-11-02 DIAGNOSIS — I483 Typical atrial flutter: Secondary | ICD-10-CM

## 2019-11-02 DIAGNOSIS — T391X1A Poisoning by 4-Aminophenol derivatives, accidental (unintentional), initial encounter: Secondary | ICD-10-CM

## 2019-11-02 DIAGNOSIS — I5020 Unspecified systolic (congestive) heart failure: Secondary | ICD-10-CM

## 2019-11-02 DIAGNOSIS — R55 Syncope and collapse: Secondary | ICD-10-CM

## 2019-11-02 DIAGNOSIS — R079 Chest pain, unspecified: Secondary | ICD-10-CM

## 2019-11-02 DIAGNOSIS — I25110 Atherosclerotic heart disease of native coronary artery with unstable angina pectoris: Secondary | ICD-10-CM

## 2019-11-02 DIAGNOSIS — I200 Unstable angina: Secondary | ICD-10-CM

## 2019-11-02 HISTORY — PX: INTRAVASCULAR PRESSURE WIRE/FFR STUDY: CATH118243

## 2019-11-02 HISTORY — PX: LEFT HEART CATH AND CORONARY ANGIOGRAPHY: CATH118249

## 2019-11-02 LAB — CBC
HCT: 41.8 % (ref 39.0–52.0)
Hemoglobin: 14.2 g/dL (ref 13.0–17.0)
MCH: 30.7 pg (ref 26.0–34.0)
MCHC: 34 g/dL (ref 30.0–36.0)
MCV: 90.5 fL (ref 80.0–100.0)
Platelets: 253 10*3/uL (ref 150–400)
RBC: 4.62 MIL/uL (ref 4.22–5.81)
RDW: 12.5 % (ref 11.5–15.5)
WBC: 5.8 10*3/uL (ref 4.0–10.5)
nRBC: 0 % (ref 0.0–0.2)

## 2019-11-02 LAB — HEPATIC FUNCTION PANEL
ALT: 26 U/L (ref 0–44)
ALT: 24 U/L (ref 0–44)
AST: 27 U/L (ref 15–41)
AST: 24 U/L (ref 15–41)
Albumin: 3.8 g/dL (ref 3.5–5.0)
Albumin: 3.7 g/dL (ref 3.5–5.0)
Alkaline Phosphatase: 51 U/L (ref 38–126)
Alkaline Phosphatase: 50 U/L (ref 38–126)
Bilirubin, Direct: <0.1 mg/dL (ref 0.0–0.2)
Bilirubin, Direct: <0.1 mg/dL (ref 0.0–0.2)
Total Bilirubin: 0.5 mg/dL (ref 0.3–1.2)
Total Bilirubin: 0.5 mg/dL (ref 0.3–1.2)
Total Protein: 6.2 g/dL — ABNORMAL LOW (ref 6.5–8.1)
Total Protein: 6.1 g/dL — ABNORMAL LOW (ref 6.5–8.1)

## 2019-11-02 LAB — ETHANOL: Alcohol, Ethyl (B): <10 mg/dL (ref <10)

## 2019-11-02 LAB — POCT ACTIVATED CLOTTING TIME: Activated Clotting Time: 279 seconds

## 2019-11-02 LAB — LIPID PANEL
Cholesterol: 144 mg/dL (ref 0–200)
HDL: 68 mg/dL (ref >40)
LDL Cholesterol: 65 mg/dL (ref 0–99)
Total CHOL/HDL Ratio: 2.1 RATIO
Triglycerides: 56 mg/dL (ref <150)
VLDL: 11 mg/dL (ref 0–40)

## 2019-11-02 LAB — BASIC METABOLIC PANEL
Anion gap: 9 (ref 5–15)
BUN: 8 mg/dL (ref 6–20)
CO2: 25 mmol/L (ref 22–32)
Calcium: 8.5 mg/dL — ABNORMAL LOW (ref 8.9–10.3)
Chloride: 107 mmol/L (ref 98–111)
Creatinine, Ser: 0.96 mg/dL (ref 0.61–1.24)
GFR calc Af Amer: >60 mL/min (ref >60)
GFR calc non Af Amer: >60 mL/min (ref >60)
Glucose, Bld: 101 mg/dL — ABNORMAL HIGH (ref 70–99)
Potassium: 3.9 mmol/L (ref 3.5–5.1)
Sodium: 141 mmol/L (ref 135–145)

## 2019-11-02 LAB — ACETAMINOPHEN LEVEL
Acetaminophen (Tylenol), Serum: 68 ug/mL — ABNORMAL HIGH (ref 10–30)
Acetaminophen (Tylenol), Serum: 24 ug/mL (ref 10–30)

## 2019-11-02 LAB — LIPASE, BLOOD: Lipase: 34 U/L (ref 11–51)

## 2019-11-02 LAB — SARS CORONAVIRUS 2 BY RT PCR (HOSPITAL ORDER, PERFORMED IN ~~LOC~~ HOSPITAL LAB): SARS Coronavirus 2: NEGATIVE

## 2019-11-02 LAB — PROTIME-INR
INR: 0.9 (ref 0.8–1.2)
Prothrombin Time: 11.4 seconds (ref 11.4–15.2)

## 2019-11-02 LAB — APTT
aPTT: 28 seconds (ref 24–36)
aPTT: 101 seconds — ABNORMAL HIGH (ref 24–36)

## 2019-11-02 SURGERY — LEFT HEART CATH AND CORONARY ANGIOGRAPHY
Anesthesia: Moderate Sedation

## 2019-11-02 MED ORDER — ATORVASTATIN CALCIUM 20 MG PO TABS
40.0000 mg | ORAL_TABLET | Freq: Every day | ORAL | Status: DC
  Filled 2019-11-02: qty 2

## 2019-11-02 MED ORDER — ASPIRIN EC 81 MG PO TBEC: 81.0000 mg | DELAYED_RELEASE_TABLET | Freq: Every day | ORAL | Status: DC

## 2019-11-02 MED ORDER — HEPARIN SODIUM (PORCINE) 1000 UNIT/ML IJ SOLN
INTRAMUSCULAR | Status: DC | PRN
  Administered 2019-11-02 (×2): 4000 [IU] via INTRAVENOUS

## 2019-11-02 MED ORDER — HEPARIN SODIUM (PORCINE) 1000 UNIT/ML IJ SOLN
INTRAMUSCULAR | Status: AC
  Filled 2019-11-02: qty 1

## 2019-11-02 MED ORDER — SODIUM CHLORIDE 0.9 % IV SOLN: INTRAVENOUS | Status: AC

## 2019-11-02 MED ORDER — IOHEXOL 300 MG/ML  SOLN
INTRAMUSCULAR | Status: DC | PRN
  Administered 2019-11-02: 95 mL

## 2019-11-02 MED ORDER — VERAPAMIL HCL 2.5 MG/ML IV SOLN
INTRAVENOUS | Status: DC | PRN
  Administered 2019-11-02: 2.5 mg via INTRA_ARTERIAL

## 2019-11-02 MED ORDER — LABETALOL HCL 5 MG/ML IV SOLN
INTRAVENOUS | Status: AC
  Filled 2019-11-02: qty 4

## 2019-11-02 MED ORDER — SODIUM CHLORIDE 0.9% FLUSH: 3.0000 mL | Freq: Two times a day (BID) | INTRAVENOUS | Status: DC

## 2019-11-02 MED ORDER — ATORVASTATIN CALCIUM 40 MG PO TABS: 40.0000 mg | ORAL_TABLET | Freq: Every day | ORAL | 0 refills | Status: AC

## 2019-11-02 MED ORDER — LIDOCAINE HCL (PF) 1 % IJ SOLN
INTRAMUSCULAR | Status: AC
  Filled 2019-11-02: qty 30

## 2019-11-02 MED ORDER — SODIUM CHLORIDE 0.9 % IV SOLN: 250.0000 mL | INTRAVENOUS | Status: DC | PRN

## 2019-11-02 MED ORDER — LABETALOL HCL 5 MG/ML IV SOLN
10.0000 mg | Freq: Once | INTRAVENOUS | Status: AC
  Administered 2019-11-02: 10 mg via INTRAVENOUS

## 2019-11-02 MED ORDER — FENTANYL CITRATE (PF) 100 MCG/2ML IJ SOLN
INTRAMUSCULAR | Status: AC
  Filled 2019-11-02: qty 2

## 2019-11-02 MED ORDER — SODIUM CHLORIDE 0.9% FLUSH: 3.0000 mL | INTRAVENOUS | Status: DC | PRN

## 2019-11-02 MED ORDER — SODIUM CHLORIDE 0.9 % WEIGHT BASED INFUSION: 1.0000 mL/kg/h | INTRAVENOUS | Status: DC

## 2019-11-02 MED ORDER — NITROGLYCERIN 0.4 MG SL SUBL: 0.4000 mg | SUBLINGUAL_TABLET | SUBLINGUAL | Status: DC | PRN

## 2019-11-02 MED ORDER — ACETAMINOPHEN 325 MG PO TABS
650.0000 mg | ORAL_TABLET | ORAL | Status: DC | PRN
  Administered 2019-11-02: 650 mg via ORAL

## 2019-11-02 MED ORDER — HEPARIN (PORCINE) IN NACL 1000-0.9 UT/500ML-% IV SOLN
INTRAVENOUS | Status: DC | PRN
  Administered 2019-11-02: 500 mL

## 2019-11-02 MED ORDER — FENTANYL CITRATE (PF) 100 MCG/2ML IJ SOLN
INTRAMUSCULAR | Status: DC | PRN
  Administered 2019-11-02: 50 ug via INTRAVENOUS

## 2019-11-02 MED ORDER — LIDOCAINE HCL (PF) 1 % IJ SOLN
INTRAMUSCULAR | Status: DC | PRN
  Administered 2019-11-02: 2 mL via SUBCUTANEOUS

## 2019-11-02 MED ORDER — ONDANSETRON HCL 4 MG/2ML IJ SOLN
INTRAMUSCULAR | Status: AC
  Filled 2019-11-02: qty 2

## 2019-11-02 MED ORDER — VERAPAMIL HCL 2.5 MG/ML IV SOLN
INTRAVENOUS | Status: AC
  Filled 2019-11-02: qty 2

## 2019-11-02 MED ORDER — HEPARIN (PORCINE) IN NACL 1000-0.9 UT/500ML-% IV SOLN
INTRAVENOUS | Status: AC
  Filled 2019-11-02: qty 1000

## 2019-11-02 MED ORDER — SODIUM CHLORIDE 0.9 % WEIGHT BASED INFUSION
3.0000 mL/kg/h | INTRAVENOUS | Status: DC
  Administered 2019-11-02: 3 mL/kg/h via INTRAVENOUS

## 2019-11-02 MED ORDER — ACETAMINOPHEN 325 MG PO TABS
ORAL_TABLET | ORAL | Status: AC
  Filled 2019-11-02: qty 1

## 2019-11-02 MED ORDER — MIDAZOLAM HCL 2 MG/2ML IJ SOLN
INTRAMUSCULAR | Status: DC | PRN
  Administered 2019-11-02: 2 mg via INTRAVENOUS

## 2019-11-02 MED ORDER — METOPROLOL SUCCINATE ER 50 MG PO TB24: 50.0000 mg | ORAL_TABLET | Freq: Two times a day (BID) | ORAL | Status: DC

## 2019-11-02 MED ORDER — METOPROLOL SUCCINATE ER 50 MG PO TB24
ORAL_TABLET | ORAL | Status: AC
  Administered 2019-11-02: 50 mg via ORAL
  Filled 2019-11-02: qty 1

## 2019-11-02 MED ORDER — MIDAZOLAM HCL 2 MG/2ML IJ SOLN
INTRAMUSCULAR | Status: AC
  Filled 2019-11-02: qty 2

## 2019-11-02 MED ORDER — ONDANSETRON HCL 4 MG/2ML IJ SOLN
4.0000 mg | Freq: Four times a day (QID) | INTRAMUSCULAR | Status: DC | PRN
  Administered 2019-11-02: 4 mg via INTRAVENOUS

## 2019-11-02 SURGICAL SUPPLY — 11 items
CATH 5F 110X4 TIG (CATHETERS) ×2 IMPLANT
CATH LAUNCHER 6FR JR4 (CATHETERS) ×2 IMPLANT
DEVICE RAD TR BAND REGULAR (VASCULAR PRODUCTS) ×2 IMPLANT
GLIDESHEATH SLEND SS 6F .021 (SHEATH) ×2 IMPLANT
GUIDEWIRE INQWIRE 1.5J.035X260 (WIRE) IMPLANT
GUIDEWIRE PRESS OMNI 185 JTIP (WIRE) ×2 IMPLANT
INQWIRE 1.5J .035X260CM (WIRE) ×3
KIT MANI 3VAL PERCEP (MISCELLANEOUS) ×3 IMPLANT
PACK CARDIAC CATH (CUSTOM PROCEDURE TRAY) ×3 IMPLANT
VALVE COPILOT STAT (MISCELLANEOUS) ×2 IMPLANT
WIRE G INSERTION TOOL (WIRE) ×2 IMPLANT

## 2019-11-02 NOTE — Progress Notes (Signed)
Pt reports he doesn't want anyone notified of results of cardiac cath.

## 2019-11-02 NOTE — OR Nursing (Signed)
Ryan from Dr Kirke Corin office will call pt with follow up apt. Prime doctor came by assessed pt and agreed with pt request for discharge.

## 2019-11-02 NOTE — Discharge Summary (Signed)
Physician Discharge Summary  Tyler [name] ZOX:[id number] DOB: 10/05/1970 DOA: 11/01/2019  PCP: Patient, No Pcp Per  Admit date: 11/01/2019 Discharge date: 11/02/2019  Admitted From: Home Disposition: Home  Recommendations for Outpatient Follow-up:  1. Follow up with PCP in 1-2 weeks 2. Follow-up with cardiology as scheduled.   Discharge Condition: Stable CODE STATUS: Full code Diet recommendation: Low-salt diet.  No alcohol.  No drugs.  No meth.  Discharge summary: 49 year old history of hypertension, a flutter on Eliquis and metoprolol, systolic heart failure and migraine, cannabis and methamphetamine user, recent abnormal coronary CT seen by cardiology was rescheduled for cardiac cath on 6/28 came to the ER with worsening left-sided chest pain.  In the emergency room is stable.  Tylenol level elevated.  Normal troponins.  Started on heparin and admitted to the hospital with cardiology consult.  Chest pain: Acute coronary syndrome ruled out.  EF 50%.  Proximal RCA 20%, distal RCA 60%, no major artery disease.  Distal RCA disease.  Currently asymptomatic. Cardiology recommended medical therapy.  Continue Eliquis.  Added atorvastatin. Strongly recommended to quit methamphetamine. Outpatient cardiology follow-up.  Elevated Tylenol level: Patient denied any suicidal ideations.  He was having chest pain and he took multiple tablets of Tylenol 6/27 morning.  Might have accidentally taken more Tylenol. Tylenol levels were 49-68-24.  More than 24 hours of Tylenol ingestion.  LFTs normal.  Poison control was contacted who recommended symptomatic treatment. Tylenol level already trending down and normalizing.  Counseled patient and family at the bedside not to use Tylenol.   Patient seen after cardiac cath and cleared by cardiology for discharge.  I offered patient for observation today in the hospital, repeat testing for Tylenol level and monitoring.  Patient is adamant about going home.  Since  he is fairly stable, Tylenol level has normalized and trended down, discharged on request.  Discharge Diagnoses:  Principal Problem:   Unstable angina (HCC) Active Problems:   Atrial flutter (HCC)   HFrEF (heart failure with reduced ejection fraction) (HCC)   Chest pain    Possible Acetaminophen toxicity    Discharge Instructions  Discharge Instructions    Diet - low sodium heart healthy   Complete by: As directed    Discharge instructions   Complete by: As directed    Start taking Eliquis tomorrow  Do not use any drugs that is not prescribed to you   Increase activity slowly   Complete by: As directed      Allergies as of 11/02/2019   No Known Allergies     Medication List    STOP taking these medications   metoprolol tartrate 100 MG tablet Commonly known as: LOPRESSOR     TAKE these medications   apixaban 5 MG Tabs tablet Commonly known as: ELIQUIS Take 1 tablet (5 mg total) by mouth 2 (two) times daily.   atorvastatin 40 MG tablet Commonly known as: LIPITOR Take 1 tablet (40 mg total) by mouth daily.   losartan 25 MG tablet Commonly known as: COZAAR Take 1 tablet (25 mg total) by mouth 2 (two) times daily.   metoprolol succinate 50 MG 24 hr tablet Commonly known as: TOPROL-XL Take 1 tablet (50 mg total) by mouth 2 times daily at 12 noon and 4 pm. Take with or immediately following a meal.   multivitamin with minerals Tabs tablet Take 1 tablet by mouth daily.       No Known Allergies  Consultations:  Cardiology   Procedures/Studies: DG Chest 2 View  Result Date: 11/01/2019 CLINICAL DATA:  Chest pain EXAM: CHEST - 2 VIEW COMPARISON:  10/24/2019 FINDINGS: Nodular densities project over both lower lungs, felt represent nipple shadows. Heart is normal size. No confluent opacities or effusions. No acute bony abnormality. Old healed right lateral 7th rib fracture. IMPRESSION: No acute cardiopulmonary disease. Electronically Signed   By: Charlett Nose  M.D.   On: 11/01/2019 21:24   DG Chest 2 View  Result Date: 10/24/2019 CLINICAL DATA:  Chest pain. Recent hospital admission and discharge from ICU with external pacemaker. Onset of chest pain and shortness of breath after drinking 2 energy drinks. EXAM: CHEST - 2 VIEW COMPARISON:  Radiograph 05/08/2019 FINDINGS: EKG leads overlie the chest.The cardiomediastinal contours are normal. Aortic atherosclerosis. Central bronchial thickening is chronic and likely smoking related. Streaky left lung base atelectasis. Pulmonary vasculature is normal. No consolidation, pleural effusion, or pneumothorax. No acute osseous abnormalities are seen. Remote right rib fracture unchanged. IMPRESSION: Streaky left lung base atelectasis. Aortic Atherosclerosis (ICD10-I70.0). Electronically Signed   By: Narda Rutherford M.D.   On: 10/24/2019 20:33   CT ANGIO CHEST PE W OR WO CONTRAST  Result Date: 10/24/2019 CLINICAL DATA:  49 year old male with shortness of breath and chest pain. EXAM: CT ANGIOGRAPHY CHEST WITH CONTRAST TECHNIQUE: Multidetector CT imaging of the chest was performed using the standard protocol during bolus administration of intravenous contrast. Multiplanar CT image reconstructions and MIPs were obtained to evaluate the vascular anatomy. CONTRAST:  62mL OMNIPAQUE IOHEXOL 350 MG/ML SOLN COMPARISON:  Chest radiograph dated 10/24/2019. FINDINGS: Cardiovascular: There is no cardiomegaly or pericardial effusion. The thoracic aorta is unremarkable. The origins of the great vessels of the aortic arch appear patent. No CT evidence of pulmonary embolism. Mediastinum/Nodes: There is no hilar or mediastinal adenopathy. The esophagus and the thyroid gland are grossly unremarkable. No mediastinal fluid collection. Lungs/Pleura: Bibasilar linear atelectasis/scarring. No focal consolidation, pleural effusion, or pneumothorax. The central airways are patent. Upper Abdomen: Mild left adrenal thickening. The visualized upper  abdomen is otherwise unremarkable. Musculoskeletal: No chest wall abnormality. No acute or significant osseous findings. Review of the MIP images confirms the above findings. IMPRESSION: No acute intrathoracic pathology. No CT evidence of pulmonary embolism. Electronically Signed   By: Elgie Collard M.D.   On: 10/24/2019 23:35   CARDIAC CATHETERIZATION  Result Date: 11/02/2019  The left ventricular ejection fraction is 50-55% by visual estimate.  LV end diastolic pressure is mildly elevated.  The left ventricular systolic function is normal.  Prox RCA lesion is 20% stenosed.  Dist RCA lesion is 60% stenosed.  1st Mrg lesion is 20% stenosed.  Prox Cx to Mid Cx lesion is 30% stenosed.  Prox LAD to Mid LAD lesion is 30% stenosed.  1.  Moderate one-vessel coronary artery disease with 60% stenosis in the distal right coronary artery.  This was not significant by fractional flow reserve evaluation with an iFR ratio of 0.98.  Moderately calcified proximal LAD with no obstructive disease. 2.  Low normal LV systolic function with an EF of 50 to 55%.  Mildly elevated left ventricular end-diastolic pressure. Recommendations: No critical CAD to explain the patient's rest pain.  Recommend medical therapy.  I added atorvastatin. Eliquis can be resumed tomorrow if no bleeding issues. The patient can be discharged home from a cardiac standpoint.   CT CORONARY MORPH W/CTA COR W/SCORE W/CA W/CM &/OR WO/CM  Addendum Date: 10/30/2019   ADDENDUM REPORT: 10/30/2019 13:59 EXAM: Cardiac/Coronary  CT TECHNIQUE: The patient was scanned on a  [employer]. FINDINGS: A 120 kV prospective scan was triggered in the descending thoracic aorta at 111 HU's. Axial non-contrast 3 mm slices were carried out through the heart. The data set was analyzed on a dedicated work station and scored using the Agatson method. Gantry rotation speed was 250 msecs and collimation was .6 mm. No beta blockade and 0.8 mg of sl NTG was given.  The 3D data set was reconstructed in 5% intervals of the 67-82 % of the R-R cycle. Diastolic phases were analyzed on a dedicated work station using MPR, MIP and VRT modes. The patient received 80 cc of contrast. Aorta:  Normal size.  No calcifications.  No dissection. Aortic Valve:  Trileaflet.  No calcifications. Coronary Arteries:  Normal coronary origin.  Right dominance. RCA is a large dominant artery that gives rise to PDA and PLVB. There is mild mixed plaque with positive remodeling in the proximal RCA with associated stenosis of 25-49%. There is mild mixed plaque in the mid and distal RCA with associated stenosis of 25-49% Left main is a large artery that gives rise to LAD and LCX arteries. There is no plaque. LAD is a large vessel that gives rise to 3 moderate sized diagonals. There is severe comples mixed plaque in the proximal LAD with associated stenosis of 70-99%. There is mild calcified plaque in the distal LAD with associated stenosis of 25-49%. LCX is a non-dominant artery that gives rise to 2 moderate sized OM1 and OM2 branches. There is moderate mixed calcified plaque in the ostial/proximal LCx with associated stenosis of 50-69% that involves the ostium of the D2. This may be overestimated by blooming artifact. Other findings: Normal pulmonary vein drainage into the left atrium. Normal let atrial appendage without a thrombus. Normal size of the pulmonary artery. IMPRESSION: 1. Coronary calcium score of 723. This was 99th percentile for age and sex matched control. 2.  Normal coronary origin with right dominance. 3. Severe atherosclerosis of the LAD. CAD-RADS=4a. This may be overestimated due to significant blooming artifact. Breathing and cardiac motion artifact limits quality of study. 4. Consider symptom-guided anti-ischemic and preventive pharmacotherapy as well as risk factor modification per guideline-directed care. 5. Other treatments (including options of revascularization) should be  considered per guideline-directed care. 6.  This study has been submitted for FFR flow analysis. Armanda Magic Electronically Signed   By: Armanda Magic   On: 10/30/2019 13:59   Result Date: 10/30/2019 EXAM: OVER-READ INTERPRETATION  CT CHEST The following report is an over-read performed by radiologist Dr. Jeronimo Greaves of [facility] on 10/30/2019. This over-read does not include interpretation of cardiac or coronary anatomy or pathology. The coronary CTA interpretation by the cardiologist is attached. COMPARISON:  10/24/2019 CTA chest FINDINGS: Vascular: Aortic atherosclerosis. No central pulmonary embolism, on this non-dedicated study. Mediastinum/Nodes: No imaged thoracic adenopathy. Lungs/Pleura: No pleural fluid. Right middle lobe calcified granuloma. Upper Abdomen: Normal imaged portions of the liver, spleen, stomach. Musculoskeletal: Remote lateral right lower rib trauma. IMPRESSION: 1. No acute findings in the imaged extracardiac chest. 2. Aortic Atherosclerosis (ICD10-I70.0). Electronically Signed: By: Jeronimo Greaves M.D. On: 10/30/2019 12:03   CT CORONARY FRACTIONAL FLOW RESERVE DATA PREP  Result Date: 10/31/2019 EXAM: FFRCT ANALYSIS FINDINGS: FFRct analysis was performed on the original cardiac CT angiogram dataset. Diagrammatic representation of the FFRct analysis is provided in a separate PDF document in PACS. This dictation was created using the PDF document and an interactive 3D model of the results. 3D model is not  available in the EMR/PACS. Normal FFR range is >0.80. 1. Left Main: No significant stenosis. LM FFR = 1.00. 2. LAD: Possible significant stenosis. Proximal FFR = 0.95, Mid FFR = 0.86, Distal FFR = 0.75 3. LCX: No significant stenosis. Proximal FFR = 0.97, Mid FFR = 0.95, Distal FFR = 0.93. 4. RCA: Possible significant stenosis. Proximal FFR = 0.99, Mid FFR = 0.86, Distal FFR = 0.77 IMPRESSION: 1. CT FFR flow analysis demonstrates possible flow limiting lesions in the mid to  distal LAD and RCA. The vessel diameter distal to the RCA lesion is 70mm and 2.61mm distal to LAD lesion. 2.    Recommend cardiac catheterization for further evaluation. Gloris Manchester Turner Electronically Signed   By: Armanda Magic   On: 10/31/2019 11:00   ECHOCARDIOGRAM COMPLETE  Result Date: 10/26/2019    ECHOCARDIOGRAM REPORT   Patient Name:   KHOURY SIEMON Date of Exam: 10/26/2019 Medical Rec #:  [id number]     Height:       68.0 in Accession #:    [id number]    Weight:       160.0 lb Date of Birth:  1971-02-03     BSA:          1.859 m Patient Age:    49 years      BP:           143/97 mmHg Patient Gender: M             HR:           74 bpm. Exam Location:  [facility] Procedure: 2D Echo, Color Doppler and Cardiac Doppler Indications:     I42.9 Cardiomyopathy-unspecified  History:         Patient has prior history of Echocardiogram examinations, most                  recent 05/08/2019. Risk Factors:Hypertension.  Sonographer:     Humphrey Rolls RDCS (AE) Referring Phys:  [address] [facility] Diagnosing Phys: Lorine Bears MD  Sonographer Comments: Suboptimal parasternal window. IMPRESSIONS  1. Left ventricular ejection fraction, by estimation, is 45 to 50%. The left ventricle has mildly decreased function. The left ventricle has no regional wall motion abnormalities. Left ventricular diastolic parameters were normal.  2. Right ventricular systolic function is normal. The right ventricular size is normal.  3. The mitral valve is normal in structure. Trivial mitral valve regurgitation. No evidence of mitral stenosis.  4. The aortic valve is normal in structure. Aortic valve regurgitation is not visualized. No aortic stenosis is present.  5. The inferior vena cava is normal in size with greater than 50% respiratory variability, suggesting right atrial pressure of 3 mmHg. Comparison(s): A prior study was performed on 05/2019. Changes from prior study are noted. Significant improvment in EF. FINDINGS  Left Ventricle: Left ventricular  ejection fraction, by estimation, is 45 to 50%. The left ventricle has mildly decreased function. The left ventricle has no regional wall motion abnormalities. The left ventricular internal cavity size was normal in size. There is no left ventricular hypertrophy. Left ventricular diastolic parameters were normal. Right Ventricle: The right ventricular size is normal. No increase in right ventricular wall thickness. Right ventricular systolic function is normal. Left Atrium: Left atrial size was normal in size. Right Atrium: Right atrial size was normal in size. Pericardium: There is no evidence of pericardial effusion. Mitral Valve: The mitral valve is normal in structure. Normal mobility of the mitral valve leaflets. Trivial mitral valve regurgitation.  No evidence of mitral valve stenosis. MV peak gradient, 2.4 mmHg. The mean mitral valve gradient is 1.0 mmHg. Tricuspid Valve: The tricuspid valve is normal in structure. Tricuspid valve regurgitation is not demonstrated. No evidence of tricuspid stenosis. Aortic Valve: The aortic valve is normal in structure. Aortic valve regurgitation is not visualized. No aortic stenosis is present. Aortic valve mean gradient measures 3.0 mmHg. Aortic valve peak gradient measures 5.0 mmHg. Aortic valve area, by VTI measures 2.27 cm. Pulmonic Valve: The pulmonic valve was normal in structure. Pulmonic valve regurgitation is not visualized. No evidence of pulmonic stenosis. Aorta: The aortic root is normal in size and structure. Venous: The inferior vena cava is normal in size with greater than 50% respiratory variability, suggesting right atrial pressure of 3 mmHg. IAS/Shunts: No atrial level shunt detected by color flow Doppler.  LEFT VENTRICLE PLAX 2D LVIDd:         4.58 cm  Diastology LVIDs:         3.50 cm  LV e' lateral:   9.57 cm/s LV PW:         0.92 cm  LV E/e' lateral: 7.3 LV IVS:        0.58 cm  LV e' medial:    9.46 cm/s LVOT diam:     2.00 cm  LV E/e' medial:  7.3 LV  SV:         54 LV SV Index:   29 LVOT Area:     3.14 cm  RIGHT VENTRICLE RV Basal diam:  3.05 cm LEFT ATRIUM             Index       RIGHT ATRIUM           Index LA diam:        3.60 cm 1.94 cm/m  RA Area:     11.20 cm LA Vol (A2C):   33.0 ml 17.75 ml/m RA Volume:   24.10 ml  12.97 ml/m LA Vol (A4C):   49.1 ml 26.41 ml/m LA Biplane Vol: 42.9 ml 23.08 ml/m  AORTIC VALVE                   PULMONIC VALVE AV Area (Vmax):    2.25 cm    PV Vmax:       0.92 m/s AV Area (Vmean):   2.08 cm    PV Vmean:      62.800 cm/s AV Area (VTI):     2.27 cm    PV VTI:        0.187 m AV Vmax:           112.00 cm/s PV Peak grad:  3.4 mmHg AV Vmean:          81.900 cm/s PV Mean grad:  2.0 mmHg AV VTI:            0.238 m AV Peak Grad:      5.0 mmHg AV Mean Grad:      3.0 mmHg LVOT Vmax:         80.30 cm/s LVOT Vmean:        54.200 cm/s LVOT VTI:          0.172 m LVOT/AV VTI ratio: 0.72  AORTA Ao Root diam: 3.20 cm MITRAL VALVE MV Area (PHT): 3.56 cm    SHUNTS MV Peak grad:  2.4 mmHg    Systemic VTI:  0.17 m MV Mean grad:  1.0 mmHg    Systemic Diam: 2.00  cm MV Vmax:       0.77 m/s MV Vmean:      38.9 cm/s MV Decel Time: 213 msec MV E velocity: 69.40 cm/s MV A velocity: 65.60 cm/s MV E/A ratio:  1.06 Kathlyn Sacramento MD Electronically signed by Kathlyn Sacramento MD Signature Date/Time: 10/26/2019/12:39:28 PM    Final      Subjective: Patient seen and examined.  Went to examine at cardiac cath to evaluate the patient.  Mother at the bedside.  Patient is up and about to walk and wants to go home.  He has some headache that improved after removal of nitroglycerin patch. Patient and mother aware about coronary angiogram findings.   Discharge Exam: Vitals:   11/02/19 1300 11/02/19 1400  BP: (!) 160/94 (!) 159/103  Pulse: (!) 57 (!) 58  Resp: 16 (!) 9  Temp:    SpO2: 99% 99%   Vitals:   11/02/19 1200 11/02/19 1230 11/02/19 1300 11/02/19 1400  BP: (!) 155/103 (!) 143/103 (!) 160/94 (!) 159/103  Pulse: (!) 49 (!) 52 (!) 57  (!) 58  Resp: 14 14 16  (!) 9  Temp:      TempSrc:      SpO2: 100% 99% 99% 99%  Weight:      Height:        General: Pt is alert, awake, not in acute distress Cardiovascular: RRR, S1/S2 +, no rubs, no gallops Respiratory: CTA bilaterally, no wheezing, no rhonchi Abdominal: Soft, NT, ND, bowel sounds + Extremities: no edema, no cyanosis Right radial access site clean and dry, minimal swelling and tenderness as expected.    The results of significant diagnostics from this hospitalization (including imaging, microbiology, ancillary and laboratory) are listed below for reference.     Microbiology: Recent Results (from the past 240 hour(s))  SARS Coronavirus 2 by RT PCR (hospital order, performed in [facility] hospital lab) Nasopharyngeal Nasopharyngeal Swab     Status: None   Collection Time: 10/24/19  9:24 PM   Specimen: Nasopharyngeal Swab  Result Value Ref Range Status   SARS Coronavirus 2 NEGATIVE NEGATIVE Final    Comment: (NOTE) SARS-CoV-2 target nucleic acids are NOT DETECTED.  The SARS-CoV-2 RNA is generally detectable in upper and lower respiratory specimens during the acute phase of infection. The lowest concentration of SARS-CoV-2 viral copies this assay can detect is 250 copies / mL. A negative result does not preclude SARS-CoV-2 infection and should not be used as the sole basis for treatment or other patient management decisions.  A negative result may occur with improper specimen collection / handling, submission of specimen other than nasopharyngeal swab, presence of viral mutation(s) within the areas targeted by this assay, and inadequate number of viral copies (<250 copies / mL). A negative result must be combined with clinical observations, patient history, and epidemiological information.  Fact Sheet for Patients:   StrictlyIdeas.no  Fact Sheet for Healthcare Providers: BankingDealers.co.za  This test is  not yet approved or  cleared by the [place] FDA and has been authorized for detection and/or diagnosis of SARS-CoV-2 by FDA under an Emergency Use Authorization (EUA).  This EUA will remain in effect (meaning this test can be used) for the duration of the COVID-19 declaration under Section 564(b)(1) of the Act, 21 U.S.C. section 360bbb-3(b)(1), unless the authorization is terminated or revoked sooner.  Performed at [facility], [address] [place], [place] [address]   SARS Coronavirus 2 by RT PCR (hospital order, performed in [place]  lab) Nasopharyngeal Nasopharyngeal Swab     Status: None   Collection Time: 11/02/19 12:25 AM   Specimen: Nasopharyngeal Swab  Result Value Ref Range Status   SARS Coronavirus 2 NEGATIVE NEGATIVE Final    Comment: (NOTE) SARS-CoV-2 target nucleic acids are NOT DETECTED.  The SARS-CoV-2 RNA is generally detectable in upper and lower respiratory specimens during the acute phase of infection. The lowest concentration of SARS-CoV-2 viral copies this assay can detect is 250 copies / mL. A negative result does not preclude SARS-CoV-2 infection and should not be used as the sole basis for treatment or other patient management decisions.  A negative result may occur with improper specimen collection / handling, submission of specimen other than nasopharyngeal swab, presence of viral mutation(s) within the areas targeted by this assay, and inadequate number of viral copies (<250 copies / mL). A negative result must be combined with clinical observations, patient history, and epidemiological information.  Fact Sheet for Patients:   BoilerBrush.com.cy  Fact Sheet for Healthcare Providers: https://pope.com/  This test is not yet approved or  cleared by the [place] FDA and has been authorized for detection and/or diagnosis of SARS-CoV-2 by FDA under an Emergency Use  Authorization (EUA).  This EUA will remain in effect (meaning this test can be used) for the duration of the COVID-19 declaration under Section 564(b)(1) of the Act, 21 U.S.C. section 360bbb-3(b)(1), unless the authorization is terminated or revoked sooner.  Performed at [facility], [address] [place], [place] [address]      Labs: BNP (last 3 results) No results for input(s): BNP in the last 8760 hours. Basic Metabolic Panel: Recent Labs  Lab 11/01/19 2049 11/02/19 0524  NA 141 141  K 4.0 3.9  CL 105 107  CO2 26 25  GLUCOSE 129* 101*  BUN 9 8  CREATININE 1.00 0.96  CALCIUM 8.7* 8.5*   Liver Function Tests: Recent Labs  Lab 11/01/19 2049 11/02/19 0524  AST 27 24  ALT 26 24  ALKPHOS 51 50  BILITOT 0.5 0.5  PROT 6.2* 6.1*  ALBUMIN 3.8 3.7   Recent Labs  Lab 11/01/19 2049  LIPASE 34   No results for input(s): AMMONIA in the last 168 hours. CBC: Recent Labs  Lab 11/01/19 2049 11/02/19 0524  WBC 6.4 5.8  HGB 14.7 14.2  HCT 42.1 41.8  MCV 89.2 90.5  PLT 271 253   Cardiac Enzymes: No results for input(s): CKTOTAL, CKMB, CKMBINDEX, TROPONINI in the last 168 hours. BNP: Invalid input(s): POCBNP CBG: No results for input(s): GLUCAP in the last 168 hours. D-Dimer No results for input(s): DDIMER in the last 72 hours. Hgb A1c No results for input(s): HGBA1C in the last 72 hours. Lipid Profile Recent Labs    11/02/19 0524  CHOL 144  HDL 68  LDLCALC 65  TRIG 56  CHOLHDL 2.1   Thyroid function studies No results for input(s): TSH, T4TOTAL, T3FREE, THYROIDAB in the last 72 hours.  Invalid input(s): FREET3 Anemia work up No results for input(s): VITAMINB12, FOLATE, FERRITIN, TIBC, IRON, RETICCTPCT in the last 72 hours. Urinalysis    Component Value Date/Time   COLORURINE YELLOW (A) 05/07/2019 2228   APPEARANCEUR CLEAR (A) 05/07/2019 2228   LABSPEC 1.012 05/07/2019 2228   PHURINE 6.0 05/07/2019 2228   GLUCOSEU NEGATIVE 05/07/2019  2228   HGBUR NEGATIVE 05/07/2019 2228   BILIRUBINUR NEGATIVE 05/07/2019 2228   KETONESUR NEGATIVE 05/07/2019 2228   PROTEINUR 30 (A) 05/07/2019 2228   NITRITE  NEGATIVE 05/07/2019 2228   LEUKOCYTESUR NEGATIVE 05/07/2019 2228   Sepsis Labs Invalid input(s): PROCALCITONIN,  WBC,  LACTICIDVEN Microbiology Recent Results (from the past 240 hour(s))  SARS Coronavirus 2 by RT PCR (hospital order, performed in [facility] hospital lab) Nasopharyngeal Nasopharyngeal Swab     Status: None   Collection Time: 10/24/19  9:24 PM   Specimen: Nasopharyngeal Swab  Result Value Ref Range Status   SARS Coronavirus 2 NEGATIVE NEGATIVE Final    Comment: (NOTE) SARS-CoV-2 target nucleic acids are NOT DETECTED.  The SARS-CoV-2 RNA is generally detectable in upper and lower respiratory specimens during the acute phase of infection. The lowest concentration of SARS-CoV-2 viral copies this assay can detect is 250 copies / mL. A negative result does not preclude SARS-CoV-2 infection and should not be used as the sole basis for treatment or other patient management decisions.  A negative result may occur with improper specimen collection / handling, submission of specimen other than nasopharyngeal swab, presence of viral mutation(s) within the areas targeted by this assay, and inadequate number of viral copies (<250 copies / mL). A negative result must be combined with clinical observations, patient history, and epidemiological information.  Fact Sheet for Patients:   BoilerBrush.com.cy  Fact Sheet for Healthcare Providers: https://pope.com/  This test is not yet approved or  cleared by the [place] FDA and has been authorized for detection and/or diagnosis of SARS-CoV-2 by FDA under an Emergency Use Authorization (EUA).  This EUA will remain in effect (meaning this test can be used) for the duration of the COVID-19 declaration under Section 564(b)(1)  of the Act, 21 U.S.C. section 360bbb-3(b)(1), unless the authorization is terminated or revoked sooner.  Performed at [facility], [address] [place], [place] [address]   SARS Coronavirus 2 by RT PCR (hospital order, performed in [facility] hospital lab) Nasopharyngeal Nasopharyngeal Swab     Status: None   Collection Time: 11/02/19 12:25 AM   Specimen: Nasopharyngeal Swab  Result Value Ref Range Status   SARS Coronavirus 2 NEGATIVE NEGATIVE Final    Comment: (NOTE) SARS-CoV-2 target nucleic acids are NOT DETECTED.  The SARS-CoV-2 RNA is generally detectable in upper and lower respiratory specimens during the acute phase of infection. The lowest concentration of SARS-CoV-2 viral copies this assay can detect is 250 copies / mL. A negative result does not preclude SARS-CoV-2 infection and should not be used as the sole basis for treatment or other patient management decisions.  A negative result may occur with improper specimen collection / handling, submission of specimen other than nasopharyngeal swab, presence of viral mutation(s) within the areas targeted by this assay, and inadequate number of viral copies (<250 copies / mL). A negative result must be combined with clinical observations, patient history, and epidemiological information.  Fact Sheet for Patients:   BoilerBrush.com.cy  Fact Sheet for Healthcare Providers: https://pope.com/  This test is not yet approved or  cleared by the [place] FDA and has been authorized for detection and/or diagnosis of SARS-CoV-2 by FDA under an Emergency Use Authorization (EUA).  This EUA will remain in effect (meaning this test can be used) for the duration of the COVID-19 declaration under Section 564(b)(1) of the Act, 21 U.S.C. section 360bbb-3(b)(1), unless the authorization is terminated or revoked sooner.  Performed at [facility], [address] [place], [place] [address]      Time coordinating discharge:  32 minutes  SIGNED:   Dorcas Carrow, MD  [facility] 11/02/2019, 2:18 PM

## 2019-11-02 NOTE — OR Nursing (Signed)
Pt agreeable with cardiac cath without drug screen

## 2019-11-02 NOTE — H&P (Signed)
History and Physical    Tyler [name] PQZ:[id number] DOB: 17-May-1970 DOA: 11/01/2019  PCP: Patient, No Pcp Per   Patient coming from: Home  I have personally briefly reviewed patient's old medical records in [facility]  Chief Complaint: Chest pain  HPI: Tyler [name] is a 49 y.o. male with medical history significant for HTN, a flutter on Eliquis, systolic heart failure and migraine, previously scheduled for elective admission for cardiac cath today on 11/02/2019, due to abnormal coronary angiography , who presents to the emergency room with chest pain.  Chest pain started on the day prior during an argument with his wife.  Described it as squeezing on the left chest of moderate intensity, nonradiating and with no associated nausea vomiting diaphoresis, palpitations, dyspnea or lightheadedness.  He denies lower extremity pain or swelling.  Denies cough, fever or chills.  Denies abdominal pain or change in bowel habits. While patient was in the ER, his wife: Told the nurse that he took an entire bottle of 50 Tylenol/Benadryl tablets.  Patient vehemently denies this.  He states he took a total of 4 Tylenol yesterday for headache with the last Tylenol being 12 hours prior.   ED Course: On arrival in the emergency room, patient was without chest pain.  Vital signs were within normal limits.  Troponin 8>>7.  No acute ST-T wave changes on EKG.  Blood work for the most part unremarkable.  Because of the concern for possible overdose of Tylenol, and acetaminophen level was done which was 49>>68.  Salicylate level less than 7, ethyl alcohol less than 10.  The emergency room provider spoke with poison control with recommendation for Mucomyst only if patient's story of taking only four acetaminophen was believable.  As patient denied suicidal ideation, Mucomyst was not given.  Patient was started on heparin infusion per cardiology prescheduled admission orders.  Hospitalist consulted for admission.  Review  of Systems: As per HPI otherwise all other systems on review of systems negative.    Past Medical History:  Diagnosis Date  . Hypertension     Past Surgical History:  Procedure Laterality Date  . NO PAST SURGERIES       reports that he has quit smoking. He smoked 1.00 pack per day. He has never used smokeless tobacco. He reports current alcohol use. He reports current drug use. Drugs: Methamphetamines and Marijuana.  No Known Allergies  Family History  Problem Relation Age of Onset  . Heart attack Mother 19  . Pancreatic cancer Father       Prior to Admission medications   Medication Sig Start Date End Date Taking? Authorizing Provider  apixaban (ELIQUIS) 5 MG TABS tablet Take 1 tablet (5 mg total) by mouth 2 (two) times daily. 05/11/19   Enedina Finner, MD  losartan (COZAAR) 25 MG tablet Take 1 tablet (25 mg total) by mouth 2 (two) times daily. 05/11/19   Enedina Finner, MD  metoprolol succinate (TOPROL-XL) 50 MG 24 hr tablet Take 1 tablet (50 mg total) by mouth 2 times daily at 12 noon and 4 pm. Take with or immediately following a meal. 05/11/19   Enedina Finner, MD  metoprolol tartrate (LOPRESSOR) 100 MG tablet Take 1 tablet (100 mg) by mouth 2 hours prior to your Cardiac CT x 1 dose. 10/26/19   Marisue Ivan D, PA-C  Multiple Vitamin (MULTIVITAMIN WITH MINERALS) TABS tablet Take 1 tablet by mouth daily. 05/12/19   Enedina Finner, MD    Physical Exam: Vitals:  11/01/19 2309 11/02/19 0043 11/02/19 0100 11/02/19 0130  BP: (!) 168/105 (!) 151/90 (!) 146/90 (!) 147/93  Pulse: 72 76 80 64  Resp: 18 15 19 19   Temp:      TempSrc:      SpO2: 98% 95% 97% 97%  Weight:      Height:         Vitals:   11/01/19 2309 11/02/19 0043 11/02/19 0100 11/02/19 0130  BP: (!) 168/105 (!) 151/90 (!) 146/90 (!) 147/93  Pulse: 72 76 80 64  Resp: 18 15 19 19   Temp:      TempSrc:      SpO2: 98% 95% 97% 97%  Weight:      Height:          Constitutional: Alert and oriented x 3 . Not in any apparent  distress HEENT:      Head: Normocephalic and atraumatic.         Eyes: PERLA, EOMI, Conjunctivae are normal. Sclera is non-icteric.       Mouth/Throat: Mucous membranes are moist.       Neck: Supple with no signs of meningismus. Cardiovascular: Regular rate and rhythm. No murmurs, gallops, or rubs. 2+ symmetrical distal pulses are present . No JVD. No LE edema Respiratory: Respiratory effort normal .Lungs sounds clear bilaterally. No wheezes, crackles, or rhonchi.  Gastrointestinal: Soft, non tender, and non distended with positive bowel sounds. No rebound or guarding. Genitourinary: No CVA tenderness. Musculoskeletal: Nontender with normal range of motion in all extremities. No cyanosis, or erythema of extremities. Neurologic: Normal speech and language. Face is symmetric. Moving all extremities. No gross focal neurologic deficits . Skin: Skin is warm, dry.  No rash or ulcers Psychiatric: Mood and affect are normal Speech and behavior are normal   Labs on Admission: I have personally reviewed following labs and imaging studies  CBC: Recent Labs  Lab 10/26/19 0702 11/01/19 2049  WBC 7.0 6.4  HGB 15.0 14.7  HCT 43.9 42.1  MCV 92.6 89.2  PLT 225 271   Basic Metabolic Panel: Recent Labs  Lab 10/26/19 0702 11/01/19 2049  NA 141 141  K 4.8 4.0  CL 110 105  CO2 26 26  GLUCOSE 109* 129*  BUN 15 9  CREATININE 0.87 1.00  CALCIUM 8.7* 8.7*   GFR: Estimated Creatinine Clearance: 88.4 mL/min (by C-G formula based on SCr of 1 mg/dL). Liver Function Tests: Recent Labs  Lab 11/01/19 2049  AST 27  ALT 26  ALKPHOS 51  BILITOT 0.5  PROT 6.2*  ALBUMIN 3.8   Recent Labs  Lab 11/01/19 2049  LIPASE 34   No results for input(s): AMMONIA in the last 168 hours. Coagulation Profile: No results for input(s): INR, PROTIME in the last 168 hours. Cardiac Enzymes: No results for input(s): CKTOTAL, CKMB, CKMBINDEX, TROPONINI in the last 168 hours. BNP (last 3 results) No results  for input(s): PROBNP in the last 8760 hours. HbA1C: No results for input(s): HGBA1C in the last 72 hours. CBG: No results for input(s): GLUCAP in the last 168 hours. Lipid Profile: No results for input(s): CHOL, HDL, LDLCALC, TRIG, CHOLHDL, LDLDIRECT in the last 72 hours. Thyroid Function Tests: No results for input(s): TSH, T4TOTAL, FREET4, T3FREE, THYROIDAB in the last 72 hours. Anemia Panel: No results for input(s): VITAMINB12, FOLATE, FERRITIN, TIBC, IRON, RETICCTPCT in the last 72 hours. Urine analysis:    Component Value Date/Time   COLORURINE YELLOW (A) 05/07/2019 2228   APPEARANCEUR CLEAR (A) 05/07/2019 2228  LABSPEC 1.012 05/07/2019 2228   PHURINE 6.0 05/07/2019 2228   GLUCOSEU NEGATIVE 05/07/2019 2228   HGBUR NEGATIVE 05/07/2019 [place] 05/07/2019 [place] 05/07/2019 2228   PROTEINUR 30 (A) 05/07/2019 2228   NITRITE NEGATIVE 05/07/2019 2228   LEUKOCYTESUR NEGATIVE 05/07/2019 2228    Radiological Exams on Admission: DG Chest 2 View  Result Date: 11/01/2019 CLINICAL DATA:  Chest pain EXAM: CHEST - 2 VIEW COMPARISON:  10/24/2019 FINDINGS: Nodular densities project over both lower lungs, felt represent nipple shadows. Heart is normal size. No confluent opacities or effusions. No acute bony abnormality. Old healed right lateral 7th rib fracture. IMPRESSION: No acute cardiopulmonary disease. Electronically Signed   By: Rolm Baptise M.D.   On: 11/01/2019 21:24    EKG: Independently reviewed. Interpretation : Sinus rhythm with no acute ST-T wave changes  Assessment/Plan Active Problems:   Chest pain -Patient presents with chest pain.  No acute EKG changes.  Troponin 8>>7 -Patient was evaluated by cardiology on 10/26/2019 for atypical chest pain but with CAD risk factors.  Plan was to bring patient in on 11/02/2019 for left heart cath given abnormal CTA coronaries on 10/29/2019. -Aspirin, statin, beta-blocker.  Nitroglycerin as needed chest  pain with morphine for breakthrough -IV heparin infusion -Cardiology consult for cardiac cath as was previously scheduled  Possible acetaminophen toxicity -Patient took 4 Tylenol pills in the last 24 hours however wife with whom he is feuding gave a different story -Patient denies suicidal ideation and vehemently refutes wife's story and insists that he took only 4 Tylenol pills -Acetaminophen level elevated at 48>>68 -Although levels for salicylate ethanol undetectable -Continue to monitor LFTs and acetaminophen level -Poison control was contacted from the ER    Atrial flutter ([place]) -Rate controlled -Eliquis on hold as patient is on heparin infusion    HFrEF (heart failure with reduced ejection fraction) ([place]) -Euvolemic -Recent outpatient EF 45 to 50%, improved from 25% in January 2021 -Per cardiology note, secondary to 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

## 2019-11-02 NOTE — Progress Notes (Signed)
ANTICOAGULATION CONSULT NOTE - Initial Consult  Pharmacy Consult for Heparin Indication: chest pain/ACS  No Known Allergies  Patient Measurements: Height: 5\' 8"  (172.7 cm) Weight: 76.7 kg (169 lb) IBW/kg (Calculated) : 68.4 HEPARIN DW (KG): 76.7  Vital Signs: Temp: 98 F (36.7 C) (06/28 0830) Temp Source: Oral (06/28 0830) BP: 148/90 (06/28 0730) Pulse Rate: 55 (06/28 0802)  Labs: Recent Labs    11/01/19 2049 11/01/19 2317 11/02/19 0524 11/02/19 0741  HGB 14.7  --  14.2  --   HCT 42.1  --  41.8  --   PLT 271  --  253  --   APTT 28  --   --  101*  LABPROT 11.4  --   --   --   INR 0.9  --   --   --   CREATININE 1.00  --  0.96  --   TROPONINIHS 8 7  --   --     Estimated Creatinine Clearance: 92 mL/min (by C-G formula based on SCr of 0.96 mg/dL).  Medical History: Past Medical History:  Diagnosis Date  . Atrial flutter (H[facility])   . CAD (coronary artery disease)   . HFrEF (heart failure with reduced ejection fraction) (HCC)   . Hypertension   . Polysubstance abuse (HCC)   . Syncope     Medications:  Medications Prior to Admission  Medication Sig Dispense Refill Last Dose  . apixaban (ELIQUIS) 5 MG TABS tablet Take 1 tablet (5 mg total) by mouth 2 (two) times daily. 60 tablet 2   . losartan (COZAAR) 25 MG tablet Take 1 tablet (25 mg total) by mouth 2 (two) times daily. 60 tablet 0   . metoprolol succinate (TOPROL-XL) 50 MG 24 hr tablet Take 1 tablet (50 mg total) by mouth 2 times daily at 12 noon and 4 pm. Take with or immediately following a meal. 60 tablet 2   . metoprolol tartrate (LOPRESSOR) 100 MG tablet Take 1 tablet (100 mg) by mouth 2 hours prior to your Cardiac CT x 1 dose. 1 tablet 0   . Multiple Vitamin (MULTIVITAMIN WITH MINERALS) TABS tablet Take 1 tablet by mouth daily. 30 tablet 0     Assessment: Pt on Eliquis PTA.  Baseline labs ordered.  Asked to initiate Heparin for ACS. Will use aPTT to adjust heparin until correlates w/ HL. No baseline heparin  level was ordered.   Goal of Therapy:  aPTT 66-102 seconds Monitor platelets by anticoagulation protocol: Yes   Plan:  APTT is therapeutic. Will continue with current rate at 1050 units/hr. Recheck aPTT in 6 hours. CBC and heparin level ordered with AM labs. Plan for cath today (6/28).   03-11-1980, PharmD, BCPS.  11/02/2019,8:48 AM

## 2019-11-02 NOTE — OR Nursing (Signed)
Pt arrived from ED. Drug screen ordered but pt refused to obtain urine sample. Pt became extremely agitated and demanding to leave hospital. Pt visibly agitated.Dr Kirke Corin notified and arrived to discuss situation with pt. Eula Listen Pa arrived and discussing risks with pt of leaving AMA.

## 2019-11-02 NOTE — OR Nursing (Signed)
Pt woke up reporting "splitting" headache. Nitroglycerin paste removed from chest, (from 0024 am) two tylenol po given.

## 2019-11-02 NOTE — OR Nursing (Signed)
Dr Kirke Corin in to see pt post cath, mother at bedisde. Pt sedated but arousable and nodded to Dr Kirke Corin.

## 2019-11-02 NOTE — ED Notes (Signed)
Patti at [employer] called  "if you believe his story then he is cleared at this point but if we don't then he would be over the nomogram line which must be based on the 0800 ingestion and patient should be treated with mucomyst and 24 hour clearance"  Dr Dolores Frame notified

## 2019-11-02 NOTE — Progress Notes (Signed)
Dr Kirke Corin office will call with follow up apt. In 2-3 weeks

## 2019-11-02 NOTE — Discharge Instructions (Signed)
Coronary Angiogram A coronary angiogram is an X-ray procedure that is used to examine the arteries in the heart. Contrast dye is injected through a long, thin tube (catheter) into these arteries. Then X-rays are taken to show any blockage in these arteries. You may have this procedure if you:  Are having chest pain, or other symptoms of angina, and you are at risk for heart disease.  Have an abnormal stress test or test of your heart's electrical activity (electrocardiogram, or ECG).  Have chest pain and heart failure.  Are having irregular heart rhythms. A coronary angiogram or heart catheterization can show if you have valve disease or a disease of the aorta. This procedure can also be used to check the overall function of your heart muscle. Let your health care provider know about:  Any allergies you have, including allergies to medicines or contrast dye.  All medicines you are taking, including vitamins, herbs, eye drops, creams, and over-the-counter medicines.  Any problems you or family members have had with anesthetic medicines.  Any blood disorders you have.  Any surgeries you have had.  Any history of kidney problems or kidney failure.  Any medical conditions you have.  Whether you are pregnant or may be pregnant.  Whether you are breastfeeding. What are the risks? Generally, this is a safe procedure. However, problems may occur, including:  Infection.  Allergic reaction to medicines or dyes that are used.  Bleeding from the insertion site or other places.  Damage to nearby structures, such as blood vessels, or damage to kidneys from contrast dye.  Irregular heart rhythms.  Stroke (rare).  Heart attack (rare). What happens before the procedure? Staying hydrated Follow instructions from your health care provider about hydration, which may include:  Up to 2 hours before the procedure - you may continue to drink clear liquids, such as water, clear fruit juice,  black coffee, and plain tea.  Eating and drinking restrictions Follow instructions from your health care provider about eating and drinking, which may include:  8 hours before the procedure - stop eating heavy meals or foods, such as meat, fried foods, or fatty foods.  6 hours before the procedure - stop eating light meals or foods, such as toast or cereal.  6 hours before the procedure - stop drinking milk or drinks that contain milk.  2 hours before the procedure - stop drinking clear liquids. Medicines Ask your health care provider about:  Changing or stopping your regular medicines. This is especially important if you are taking diabetes medicines or blood thinners.  Taking medicines such as aspirin and ibuprofen. These medicines can thin your blood. Do not take these medicines unless your health care provider tells you to take them. Aspirin may be recommended before coronary angiograms even if you do not normally take it.  Taking over-the-counter medicines, vitamins, herbs, and supplements. General instructions  Do not use any products that contain nicotine or tobacco for at least 4 weeks before the procedure. These products include cigarettes, e-cigarettes, and chewing tobacco. If you need help quitting, ask your health care provider.  You may have an exam or testing.  Plan to have someone take you home from the hospital or clinic.  If you will be going home right after the procedure, plan to have someone with you for 24 hours.  Ask your health care provider: ? How your insertion site will be marked. ? What steps will be taken to help prevent infection. These may include:  Removing   hair at the insertion site.  Washing skin with a germ-killing soap.  Taking antibiotic medicine. What happens during the procedure?   You will lie on your back on an X-ray table.  An IV will be inserted into one of your veins.  Electrodes will be placed on your chest.  You will be  given one or more of the following: ? A medicine to help you relax (sedative). ? A medicine to numb the catheter insertion area (local anesthetic).  You will be connected to a continuous ECG monitor.  The catheter will be inserted into an artery in one of these areas: ? Your groin area in your upper thigh. ? Your wrist. ? The fold of your arm, near your elbow.  An X-ray procedure (fluoroscopy) will be used to help guide the catheter to the opening of the blood vessel to be used.  A dye will be injected into the catheter and X-rays will be taken. The dye will help to show any narrowing or blockages in the heart arteries.  Tell your health care provider if you have chest pain or trouble breathing.  If blockages are found, another procedure may be done to open the artery.  The catheter will be removed after the fluoroscopy is complete.  A bandage (dressing) will be placed over the insertion site. Pressure will be applied to stop bleeding.  The IV will be removed. The procedure may vary among health care providers and hospitals. What happens after the procedure?  Your blood pressure, heart rate, breathing rate, and blood oxygen level will be monitored until you leave the hospital or clinic.  You will need to lie still for a few hours, or for as long as told by your health care provider. ? If the procedure is done through the groin, you will be told not to bend or cross your legs.  The insertion site and the pulse in your foot or wrist will be checked often.  More blood tests, X-rays, and an ECG may be done.  Do not drive for 24 hours if you were given a sedative during your procedure. Summary  A coronary angiogram is an X-ray procedure that is used to examine the arteries in the heart.  Contrast dye is injected through a long, thin tube (catheter) into each artery.  Tell your health care provider about any allergies you have, including allergies to contrast dye.  After the  procedure, you will need to lie still for a few hours and drink plenty of fluids. This information is not intended to replace advice given to you by your health care provider. Make sure you discuss any questions you have with your health care provider. Document Revised: 11/13/2018 Document Reviewed: 11/13/2018 Elsevier Patient Education  2020 [employer]vier Inc[employer] 

## 2019-11-02 NOTE — Progress Notes (Signed)
ANTICOAGULATION CONSULT NOTE - Initial Consult  Pharmacy Consult for Heparin Indication: chest pain/ACS  No Known Allergies  Patient Measurements: Height: 5\' 8"  (172.7 cm) Weight: 76.7 kg (169 lb) IBW/kg (Calculated) : 68.4 HEPARIN DW (KG): 76.7  Vital Signs: Temp: 98.2 F (36.8 C) (06/27 2044) Temp Source: Oral (06/27 2044) BP: 168/105 (06/27 2309) Pulse Rate: 72 (06/27 2309)  Labs: Recent Labs    11/01/19 2049 11/01/19 2317  HGB 14.7  --   HCT 42.1  --   PLT 271  --   CREATININE 1.00  --   TROPONINIHS 8 7    Estimated Creatinine Clearance: 88.4 mL/min (by C-G formula based on SCr of 1 mg/dL).  Medical History: Past Medical History:  Diagnosis Date  . Hypertension     Medications:  (Not in a hospital admission)   Assessment: Pt on Eliquis PTA.  Baseline labs ordered.  Asked to initiate Heparin for ACS. Will use aPTT to adjust heparin until correlates w/ HL.  Goal of Therapy:  aPTT 66-102 seconds Monitor platelets by anticoagulation protocol: Yes   Plan:  Heparin 4000 units bolus IV x 1 now then infusion at 1050 units/hr Check aPTT 6 hours after heparin started  Marton, Malizia A 11/02/2019,12:33 AM

## 2019-11-02 NOTE — Consult Note (Signed)
Cardiology Consultation:   Patient ID: Tyler [name]; [id number]; Nov 20, 1970   Admit date: 11/01/2019 Date of Consult: 11/02/2019  Primary Care Provider: Patient, No Pcp Per Primary Cardiologist: Agbor-Etang Primary Electrophysiologist:  None   Patient Profile:   Tyler [name] is a 49 y.o. male with a hx of CAD by noninvasive imaging as outlined below, HFrEF, atrial flutter on Eliquis, HTN, tobacco use, and methamphetamine use who is being seen today for the evaluation of [place] at the request of Dr. Para March.  History of Present Illness:   Mr. Ealy was admitted to the hospital in 04/2019 with new onset atrial flutter with RVR with noted syncope.  Drug screen positive for amphetamine, cannabinoid, and benzodiazepine.  Initial high-sensitivity troponin 87, not cycled.  Echo showed an EF of 25 to 30%, global hypokinesis, severely reduced RV systolic function with normal RV ventricular cavity size, and trivial mitral regurgitation.  He converted to sinus rhythm without cardioversion during his admission with recommendation to continue Toprol and Eliquis with plans for EP evaluation as an outpatient.  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  LifeVest was recommended.  He was readmitted in late 10/2019 with chest pain.  Initial high-sensitivity troponin VIII with a delta of 7.  He was maintaining sinus rhythm.  Repeat echo demonstrated an improved LVSF with an EF of 45 to 50%, normal LV diastolic function parameters, normal RV systolic function and ventricular cavity size, trivial mitral regurgitation.  He was not compliant with LifeVest.  Plans were arranged for patient to undergo outpatient coronary CTA on 6/24 which showed a coronary calcium score 723 which was 99th percentile for age and sex matched control.  There was no plaque along the left main, severe stenosis within the proximal LAD estimated at 70 to 99% with 25 to 49% stenosis in the distal LAD, nondominant LCx with  ostial to proximal stenosis estimated at 50 to 69% that involved the ostium of D2, 25 to 49% proximal RCA stenosis with 25 to 49% mid and distal RCA stenoses.  The estimated stenoses involving th LAD and LCx or possibly overestimated due to significant blooming artifact.  FFR was normal in the proximal and mid LAD and found to be clinically significant in the distal LAD with an FFR of 0.75.  FFR of the LCx normal.  FFR of the distal RCA abnormal at 0.77.  In this setting, patient was contacted on 6/26 and advised to come into the ED given rest angina.  He was also advised to hold Eliquis at that time.  Patient presented to the ED on the evening of 6/27 as discussed over the phone with continued randomly occurring intermittent substernal chest pressure without associated symptoms.  Initial BP elevated in the 170s systolic.  He denies any worsening pain or increased frequency.  No further presyncope or syncope.  No palpitations, dizziness, lower extremity swelling, abdominal distention, orthopnea, PND, early satiety.  He reports his last dose of Eliquis was on the evening of 6/23.  He reports he was advised to discontinue this medication leading up to his coronary CTA.  Initial high-sensitivity troponin VIII with a delta of 7.  EKG nonacute as outlined below.  Chest x-ray nonacute.  Currently without chest pain.    Past Medical History:  Diagnosis Date   Atrial flutter (HCC)    CAD (coronary artery disease)    HFrEF (heart failure with reduced ejection fraction) (HCC)    Hypertension    Polysubstance abuse (  HCC)    Syncope     Past Surgical History:  Procedure Laterality Date   NO PAST SURGERIES       Home Meds: Prior to Admission medications   Medication Sig Start Date End Date Taking? Authorizing Provider  apixaban (ELIQUIS) 5 MG TABS tablet Take 1 tablet (5 mg total) by mouth 2 (two) times daily. 05/11/19   Enedina Finner, MD  losartan (COZAAR) 25 MG tablet Take 1 tablet (25 mg total) by  mouth 2 (two) times daily. 05/11/19   Enedina Finner, MD  metoprolol succinate (TOPROL-XL) 50 MG 24 hr tablet Take 1 tablet (50 mg total) by mouth 2 times daily at 12 noon and 4 pm. Take with or immediately following a meal. 05/11/19   Enedina Finner, MD  metoprolol tartrate (LOPRESSOR) 100 MG tablet Take 1 tablet (100 mg) by mouth 2 hours prior to your Cardiac CT x 1 dose. 10/26/19   Marisue Ivan D, PA-C  Multiple Vitamin (MULTIVITAMIN WITH MINERALS) TABS tablet Take 1 tablet by mouth daily. 05/12/19   Enedina Finner, MD    Inpatient Medications: Scheduled Meds:  [START ON 11/03/2019] aspirin EC  81 mg Oral Daily   metoprolol succinate  50 mg Oral BID PC   Continuous Infusions:  heparin 1,050 Units/hr (11/02/19 0520)   PRN Meds: nitroGLYCERIN, ondansetron (ZOFRAN) IV  Allergies:  No Known Allergies  Social History:   Social History   Socioeconomic History   Marital status: Married    Spouse name: Not on file   Number of children: Not on file   Years of education: Not on file   Highest education level: Not on file  Occupational History   Not on file  Tobacco Use   Smoking status: Former Smoker    Packs/day: 1.00   Smokeless tobacco: Never Used  Substance and Sexual Activity   Alcohol use: Yes    Comment: beer daily   Drug use: Yes    Types: Methamphetamines, Marijuana   Sexual activity: Not on file  Other Topics Concern   Not on file  Social History Narrative   One daughter.     Social Determinants of Health   Financial Resource Strain:    Difficulty of Paying Living Expenses:   Food Insecurity:    Worried About Programme researcher, broadcasting/film/video in the Last Year:    Barista in the Last Year:   Transportation Needs:    Freight forwarder (Medical):    Lack of Transportation (Non-Medical):   Physical Activity:    Days of Exercise per Week:    Minutes of Exercise per Session:   Stress:    Feeling of Stress :   Social Connections:    Frequency of  Communication with Friends and Family:    Frequency of Social Gatherings with Friends and Family:    Attends Religious Services:    Active Member of Clubs or Organizations:    Attends Engineer, structural:    Marital Status:   Intimate Partner Violence:    Fear of Current or Ex-Partner:    Emotionally Abused:    Physically Abused:    Sexually Abused:      Family History:   Family History  Problem Relation Age of Onset   Heart attack Mother 55   Pancreatic cancer Father     ROS:  Review of Systems  Constitutional: Positive for malaise/fatigue. Negative for chills, diaphoresis, fever and weight loss.  HENT: Negative for congestion.   Eyes:  Negative for discharge and redness.  Respiratory: Negative for cough, hemoptysis, sputum production, shortness of breath and wheezing.   Cardiovascular: Positive for chest pain. Negative for palpitations, orthopnea, claudication, leg swelling and PND.  Gastrointestinal: Negative for abdominal pain, blood in stool, heartburn, melena, nausea and vomiting.  Genitourinary: Negative for hematuria.  Musculoskeletal: Negative for falls and myalgias.  Skin: Negative for rash.  Neurological: Negative for dizziness, tingling, tremors, sensory change, speech change, focal weakness, loss of consciousness and weakness.  Endo/Heme/Allergies: Does not bruise/bleed easily.  Psychiatric/Behavioral: Negative for substance abuse. The patient is not nervous/anxious.   All other systems reviewed and are negative.     Physical Exam/Data:   Vitals:   11/02/19 0527 11/02/19 0530 11/02/19 0600 11/02/19 0700  BP: (!) 151/96 (!) 166/99 (!) 162/103 (!) 148/100  Pulse: (!) 59 (!) 56 62 61  Resp: 16 17 18 14   Temp:      TempSrc:      SpO2: 96% 98% 100% 97%  Weight:      Height:        Intake/Output Summary (Last 24 hours) at 11/02/2019 0741 Last data filed at 11/02/2019 0520 Gross per 24 hour  Intake 47.53 ml  Output --  Net 47.53 ml    Filed Weights   11/01/19 2040  Weight: 76.7 kg   Body mass index is 25.7 kg/m.   Physical Exam: General: Well developed, well nourished, in no acute distress. Head: Normocephalic, atraumatic, sclera non-icteric, no xanthomas, nares without discharge.  Neck: Negative for carotid bruits. JVD not elevated. Lungs: Clear bilaterally to auscultation without wheezes, rales, or rhonchi. Breathing is unlabored. Heart: RRR with S1 S2. No murmurs, rubs, or gallops appreciated. Abdomen: Soft, non-tender, non-distended with normoactive bowel sounds. No hepatomegaly. No rebound/guarding. No obvious abdominal masses. Msk:  Strength and tone appear normal for age. Extremities: No clubbing or cyanosis. No edema. Distal pedal pulses are 2+ and equal bilaterally. Neuro: Alert and oriented X 3. No facial asymmetry. No focal deficit. Moves all extremities spontaneously. Psych:  Responds to questions appropriately with a normal affect.   EKG:  The EKG was personally reviewed and demonstrates: NSR, 77 bpm, no acute st/t changes Telemetry:  Telemetry was personally reviewed and demonstrates: SR  Weights: [employer]   11/01/19 2040  Weight: 76.7 kg    Relevant CV Studies:  2D echo 10/26/2019: 1. Left ventricular ejection fraction, by estimation, is 45 to 50%. The  left ventricle has mildly decreased function. The left ventricle has no  regional wall motion abnormalities. Left ventricular diastolic parameters  were normal.  2. Right ventricular systolic function is normal. The right ventricular  size is normal.  3. The mitral valve is normal in structure. Trivial mitral valve  regurgitation. No evidence of mitral stenosis.  4. The aortic valve is normal in structure. Aortic valve regurgitation is  not visualized. No aortic stenosis is present.  5. The inferior vena cava is normal in size with greater than 50%  respiratory variability, suggesting right atrial pressure of 3  mmHg. __________  Coronary CTA 10/31/2019: 1. Left Main: No significant stenosis. LM FFR = 1.00.  2. LAD: Possible significant stenosis. Proximal FFR = 0.95, Mid FFR = 0.86, Distal FFR = 0.75  3. LCX: No significant stenosis. Proximal FFR = 0.97, Mid FFR = 0.95, Distal FFR = 0.93.  4. RCA: Possible significant stenosis. Proximal FFR = 0.99, Mid FFR = 0.86, Distal FFR = 0.77  IMPRESSION: 1. CT FFR flow analysis demonstrates possible  flow limiting lesions in the mid to distal LAD and RCA. The vessel diameter distal to the RCA lesion is 49mm and 2.72mm distal to LAD lesion.  2.    Recommend cardiac catheterization for further evaluation.   Coronary Arteries:  Normal coronary origin.  Right dominance.  RCA is a large dominant artery that gives rise to PDA and PLVB. There is mild mixed plaque with positive remodeling in the proximal RCA with associated stenosis of 25-49%. There is mild mixed plaque in the mid and distal RCA with associated stenosis of 25-49%  Left main is a large artery that gives rise to LAD and LCX arteries. There is no plaque.  LAD is a large vessel that gives rise to 3 moderate sized diagonals. There is severe comples mixed plaque in the proximal LAD with associated stenosis of 70-99%. There is mild calcified plaque in the distal LAD with associated stenosis of 25-49%.  LCX is a non-dominant artery that gives rise to 2 moderate sized OM1 and OM2 branches. There is moderate mixed calcified plaque in the ostial/proximal LCx with associated stenosis of 50-69% that involves the ostium of the D2. This may be overestimated by blooming artifact.  Other findings:  Normal pulmonary vein drainage into the left atrium.  Normal let atrial appendage without a thrombus.  Normal size of the pulmonary artery.  IMPRESSION: 1. Coronary calcium score of 723. This was 99th percentile for age and sex matched control.  2.  Normal coronary origin with  right dominance.  3. Severe atherosclerosis of the LAD. CAD-RADS=4a. This may be overestimated due to significant blooming artifact. Breathing and cardiac motion artifact limits quality of study.  4. Consider symptom-guided anti-ischemic and preventive pharmacotherapy as well as risk factor modification per guideline-directed care.  5. Other treatments (including options of revascularization) should be considered per guideline-directed care.  6.  This study has been submitted for FFR flow analysis.  Laboratory Data:  Chemistry Recent Labs  Lab 11/01/19 2049 11/02/19 0524  NA 141 141  K 4.0 3.9  CL 105 107  CO2 26 25  GLUCOSE 129* 101*  BUN 9 8  CREATININE 1.00 0.96  CALCIUM 8.7* 8.5*  GFRNONAA >60 >60  GFRAA >60 >60  ANIONGAP 10 9    Recent Labs  Lab 11/01/19 2049 11/02/19 0524  PROT 6.2* 6.1*  ALBUMIN 3.8 3.7  AST 27 24  ALT 26 24  ALKPHOS 51 50  BILITOT 0.5 0.5   Hematology Recent Labs  Lab 11/01/19 2049 11/02/19 0524  WBC 6.4 5.8  RBC 4.72 4.62  HGB 14.7 14.2  HCT 42.1 41.8  MCV 89.2 90.5  MCH 31.1 30.7  MCHC 34.9 34.0  RDW 12.5 12.5  PLT 271 253   Cardiac EnzymesNo results for input(s): TROPONINI in the last 168 hours. No results for input(s): TROPIPOC in the last 168 hours.  BNPNo results for input(s): BNP, PROBNP in the last 168 hours.  DDimer No results for input(s): DDIMER in the last 168 hours.  Radiology/Studies:  DG Chest 2 View  Result Date: 11/01/2019 IMPRESSION: No acute cardiopulmonary disease. Electronically Signed   By: Charlett Nose M.D.   On: 11/01/2019 21:24   CT CORONARY MORPH W/CTA COR W/SCORE W/CA W/CM &/OR WO/CM  Addendum Date: 10/30/2019   IMPRESSION: 1. Coronary calcium score of 723. This was 99th percentile for age and sex matched control. 2.  Normal coronary origin with right dominance. 3. Severe atherosclerosis of the LAD. CAD-RADS=4a. This may be overestimated due to  significant blooming artifact. Breathing and  cardiac motion artifact limits quality of study. 4. Consider symptom-guided anti-ischemic and preventive pharmacotherapy as well as risk factor modification per guideline-directed care. 5. Other treatments (including options of revascularization) should be considered per guideline-directed care. 6.  This study has been submitted for FFR flow analysis. Fransico Him Electronically Signed   By: Fransico Him   On: 10/30/2019 13:59   Result Date: 10/30/2019 IMPRESSION: 1. No acute findings in the imaged extracardiac chest. 2. Aortic Atherosclerosis (ICD10-I70.0). Electronically Signed: By: Abigail Miyamoto M.D. On: 10/30/2019 12:03    Assessment and Plan:   1.  CAD involving the native coronary arteries with unstable angina: -Currently chest pain-free -Recent coronary CTA with positive FFR involving the distal LAD and RCA -Given angina noted at rest patient contacted and advised to come in preparation for diagnostic LHC later this morning -ASA -Heparin drip -N.p.o. -LHC second case this morning -Risks and benefits of cardiac catheterization have been discussed with the patient including risks of bleeding, bruising, infection, kidney damage, stroke, heart attack, urgent need for bypass, injury to limb, and death. The patient understands these risks and is willing to proceed with the procedure. All questions have been answered and concerns listened to  2.  HFrEF: -Felt to be tachycardia mediated in the setting of atrial flutter with RVR -Recent echo last week demonstrated improvement in LV systolic function while in sinus rhythm -Diagnostic LHC as outlined above -Toprol -Losartan -Consider addition of MRA post-cath -CHF education -Daily weights with strict I's and O's  3.  Atrial flutter: -Maintaining sinus rhythm -Toprol-XL -CHA2DS2-VASc at least 3 (CHF, HTN, vascular disease) -Eliquis on hold in preparation for LHC with patient reported last dose on 3/23 -Heparin drip for now with plan to  resume [place] prior to discharge -Follow-up with EP for consideration of atrial flutter ablation as outpatient  4.  Syncope: -Unclear etiology -Evaluated by EP with recommendation for LifeVest which the patient has been noncompliant -No driving x6 months dating back to 05/2019 -Monitor on telemetry -Follow-up with EP as outpatient -Ischemic evaluation as outlined above  5.  Polysubstance use: -Check urine drug screen  6.  HTN: -Improving -Continue therapy as outlined above  7.  HLD: -LDL 65 this admission -Add Lipitor 40 mg daily    For questions or updates, please contact [place] Please consult [email or web address] for contact info under Cardiology/STEMI.   Signed, Christell Faith, PA-C [place] Pager: [phone] 11/02/2019, 7:41 AM

## 2019-11-03 ENCOUNTER — Telehealth: Payer: Self-pay | Admitting: Cardiology

## 2019-11-03 NOTE — Telephone Encounter (Signed)
-----   Message from Sondra Barges, PA-C sent at 11/02/2019  2:42 PM EDT ----- Can you get him set up to see Dr. Azucena Cecil or APP in 2 weeks for hospital follow up? Thanks!

## 2019-11-03 NOTE — Telephone Encounter (Signed)
Reviewed the patient's chart in preparation of trying to call him again. I noticed that he did end up having a heart cath with Dr. Kirke Corin yesterday.  Will close this encounter.

## 2019-11-03 NOTE — Telephone Encounter (Signed)
Attempted to schedule.  LMOV to call office.  ° °

## 2019-11-03 NOTE — Telephone Encounter (Deleted)
-----   Message from Ryan M Dunn, PA-C sent at 11/02/2019  2:42 PM EDT ----- Can you get him set up to see Dr. Agbor-Etang or APP in 2 weeks for hospital follow up? Thanks!  

## 2019-11-11 NOTE — Progress Notes (Deleted)
Cardiology Office Note    Date:  11/11/2019   ID:  Tyler [name], DOB Dec 17, 1970, MRN [id number]  PCP:  Patient, No Pcp Per  Cardiologist:  Debbe Odea, MD  Electrophysiologist:  Sherryl Manges, MD   Chief Complaint: Hospital follow-up  History of Present Illness:   Tyler [name] is a 49 y.o. male with history of nonobstructive CAD by LHC in 10/2019 as outlined below, HFrEF secondary to NICM, atrial flutter on Eliquis, HTN, tobacco use, and methamphetamine use who presents for hospital follow-up after diagnostic cath.  He was admitted to the hospital in 04/2019 with new onset atrial flutter with RVR with noted syncope.  Drug screen positive for amphetamine, cannabinoid, and benzodiazepine.  Initial high-sensitivity troponin 87.  Echo showed an EF of 25 to 30%, global hypokinesis, severely reduced RV systolic function with normal RV ventricular cavity size, and trivial mitral regurgitation.  He converted to sinus rhythm without cardioversion during his admission with recommendation to continue Toprol and Eliquis with plans for EP evaluation as an outpatient.  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  LifeVest was recommended.  He was readmitted in late 10/2019 with chest pain.  Initial high-sensitivity troponin 8 with a delta of 7.  He was maintaining sinus rhythm.  Repeat echo demonstrated an improved LVSF with an EF of 45 to 50%, normal LV diastolic function parameters, normal RV systolic function and ventricular cavity size, trivial mitral regurgitation.  He was not compliant with LifeVest.  Plans were arranged for patient to undergo outpatient coronary CTA on 6/24 which showed a coronary calcium score 723 which was 99th percentile for age and sex matched control.  There was no plaque along the left main, severe stenosis within the proximal LAD estimated at 70 to 99% with 25 to 49% stenosis in the distal LAD, nondominant LCx with ostial to proximal stenosis estimated at 50  to 69% that involved the ostium of D2, 25 to 49% proximal RCA stenosis with 25 to 49% mid and distal RCA stenoses.  The estimated stenoses involving th LAD and LCx or possibly overestimated due to significant blooming artifact.  FFR was normal in the proximal and mid LAD and found to be clinically significant in the distal LAD with an FFR of 0.75.  FFR of the LCx normal.  FFR of the distal RCA abnormal at 0.77.    At the recommendation of the reading physician, patient was contacted on 6/26 and advised to come into the ED given rest angina.    He subsequently presented to the ED on 6/27 with continued randomly occurring intermittently substernal chest pressure without associated symptoms.  Initial BP is elevated in the 170s.  He reported last using methamphetamine 2 days prior.  Initial high-sensitivity troponin 8 with a delta of seven.  EKG was nonacute.  He underwent diagnostic LHC on 11/02/2019 which showed moderate one-vessel CAD with 60% stenosis in the distal RCA which was not significant by fractional flow reserve with an IFR ratio of 0.98.  The LAD was moderately calcified in the proximal portion without obstructive disease.  He had a low normal LV SF with an EF of 50 to 55% and a mildly elevated LVEDP.  There was no critical CAD to explain his rest angina with recommendation to continue medical therapy.  ***   Labs independently reviewed: 10/2019 - Hgb 14.2, PLT 253, TC 144, TG 56, HDL 68, LDL 65, potassium 3.9, BUN eight, serum creatinine 0.96, albumin 3.7, AST/ALT normal  04/2019 - TSH normal, magnesium 2.0  Past Medical History:  Diagnosis Date  . Atrial flutter (HCC)   . CAD (coronary artery disease)   . HFrEF (heart failure with reduced ejection fraction) (HCC)   . Hypertension   . Polysubstance abuse (HCC)   . Syncope     Past Surgical History:  Procedure Laterality Date  . INTRAVASCULAR PRESSURE WIRE/FFR STUDY N/A 11/02/2019   Procedure: INTRAVASCULAR PRESSURE WIRE/FFR STUDY;   Surgeon: Iran Ouch, MD;  Location: [facility] INVASIVE CV LAB;  Service: Cardiovascular;  Laterality: N/A;  . LEFT HEART CATH AND CORONARY ANGIOGRAPHY N/A 11/02/2019   Procedure: LEFT HEART CATH AND CORONARY ANGIOGRAPHY;  Surgeon: Iran Ouch, MD;  Location: [facility] INVASIVE CV LAB;  Service: Cardiovascular;  Laterality: N/A;  . NO PAST SURGERIES      Current Medications: No outpatient medications have been marked as taking for the 11/13/19 encounter (Appointment) with Sondra Barges, PA-C.    Allergies:   Patient has no known allergies.   Social History   Socioeconomic History  . Marital status: Married    Spouse name: Not on file  . Number of children: Not on file  . Years of education: Not on file  . Highest education level: Not on file  Occupational History  . Not on file  Tobacco Use  . Smoking status: Former Smoker    Packs/day: 1.00  . Smokeless tobacco: Never Used  Substance and Sexual Activity  . Alcohol use: Yes    Comment: beer daily  . Drug use: Yes    Types: Methamphetamines, Marijuana  . Sexual activity: Not on file  Other Topics Concern  . Not on file  Social History Narrative   One daughter.     Social Determinants of Health   Financial Resource Strain:   . Difficulty of Paying Living Expenses:   Food Insecurity:   . Worried About Programme researcher, broadcasting/film/video in the Last Year:   . Barista in the Last Year:   Transportation Needs:   . Freight forwarder (Medical):   Marland Kitchen Lack of Transportation (Non-Medical):   Physical Activity:   . Days of Exercise per Week:   . Minutes of Exercise per Session:   Stress:   . Feeling of Stress :   Social Connections:   . Frequency of Communication with Friends and Family:   . Frequency of Social Gatherings with Friends and Family:   . Attends Religious Services:   . Active Member of Clubs or Organizations:   . Attends Banker Meetings:   Marland Kitchen Marital Status:      Family History:  The patient's  family history includes Heart attack (age of onset: 24) in his mother; Pancreatic cancer in his father.  ROS:   ROS   EKGs/Labs/Other Studies Reviewed:    Studies reviewed were summarized above. The additional studies were reviewed today:  LHC 11/02/2019:  The left ventricular ejection fraction is 50-55% by visual estimate.  LV end diastolic pressure is mildly elevated.  The left ventricular systolic function is normal.  Prox RCA lesion is 20% stenosed.  Dist RCA lesion is 60% stenosed.  1st Mrg lesion is 20% stenosed.  Prox Cx to Mid Cx lesion is 30% stenosed.  Prox LAD to Mid LAD lesion is 30% stenosed.   1.  Moderate one-vessel coronary artery disease with 60% stenosis in the distal right coronary artery.  This was not significant by fractional flow reserve evaluation with an  iFR ratio of 0.98.  Moderately calcified proximal LAD with no obstructive disease. 2.  Low normal LV systolic function with an EF of 50 to 55%.  Mildly elevated left ventricular end-diastolic pressure.  Recommendations: No critical CAD to explain the patient's rest pain.  Recommend medical therapy.  I added atorvastatin. Eliquis can be resumed tomorrow if no bleeding issues. The patient can be discharged home from a cardiac standpoint. __________  Coronary CTA 10/30/2019: IMPRESSION: 1. Coronary calcium score of 723. This was 99th percentile for age and sex matched control.  2.  Normal coronary origin with right dominance.  3. Severe atherosclerosis of the LAD. CAD-RADS=4a. This may be overestimated due to significant blooming artifact. Breathing and cardiac motion artifact limits quality of study.  4. Consider symptom-guided anti-ischemic and preventive pharmacotherapy as well as risk factor modification per guideline-directed care.  5. Other treatments (including options of revascularization) should be considered per guideline-directed care.  6.  This study has been submitted for  FFR flow analysis.  IMPRESSION: 1. CT FFR flow analysis demonstrates possible flow limiting lesions in the mid to distal LAD and RCA. The vessel diameter distal to the RCA lesion is 20mm and 2.24mm distal to LAD lesion.  2.    Recommend cardiac catheterization for further evaluation. __________  2D echo 10/26/2019: 1. Left ventricular ejection fraction, by estimation, is 45 to 50%. The  left ventricle has mildly decreased function. The left ventricle has no  regional wall motion abnormalities. Left ventricular diastolic parameters  were normal.  2. Right ventricular systolic function is normal. The right ventricular  size is normal.  3. The mitral valve is normal in structure. Trivial mitral valve  regurgitation. No evidence of mitral stenosis.  4. The aortic valve is normal in structure. Aortic valve regurgitation is  not visualized. No aortic stenosis is present.  5. The inferior vena cava is normal in size with greater than 50%  respiratory variability, suggesting right atrial pressure of 3 mmHg. __________  2D echo 05/08/2019: 1. Left ventricular ejection fraction, by visual estimation, is 25 to  30%. The left ventricle has severely decreased function. There is no left  ventricular hypertrophy.  2. Definity contrast agent was given IV to delineate the left ventricular  endocardial borders.  3. Indeterminate diastolic filling due to E-A fusion.  4. The left ventricle demonstrates global hypokinesis.  5. Global right ventricle has severely reduced systolic function.The  right ventricular size is normal. Right vetricular wall thickness was not  assessed.  6. Left atrial size was normal.  7. Right atrial size was normal.  8. The mitral valve is normal in structure. Trivial mitral valve  regurgitation.  9. The tricuspid valve is normal in structure.  10. The aortic valve is normal in structure. Aortic valve regurgitation is  not visualized.  11. The pulmonic valve  was normal in structure. Pulmonic valve  regurgitation is not visualized.  12. Normal pulmonary artery systolic pressure.  13. The inferior vena cava is normal in size with <50% respiratory  variability, suggesting right atrial pressure of 8 mmHg.  EKG:  EKG is ordered today.  The EKG ordered today demonstrates ***  Recent Labs: 05/07/2019: Magnesium 2.0; TSH 3.250 11/02/2019: ALT 24; BUN 8; Creatinine, Ser 0.96; Hemoglobin 14.2; Platelets 253; Potassium 3.9; Sodium 141  Recent Lipid Panel    Component Value Date/Time   CHOL 144 11/02/2019 0524   TRIG 56 11/02/2019 0524   HDL 68 11/02/2019 0524   CHOLHDL 2.1 11/02/2019 0524  VLDL 11 11/02/2019 0524   LDLCALC 65 11/02/2019 0524    PHYSICAL EXAM:    VS:  There were no vitals taken for this visit.  BMI: There is no height or weight on file to calculate BMI.  Physical Exam  Wt Readings from Last 3 Encounters:  11/01/19 169 lb (76.7 kg)  10/24/19 160 lb (72.6 kg)  05/11/19 161 lb 13.1 oz (73.4 kg)     ASSESSMENT & PLAN:   1. Nonobstructive CAD: ***  2. HFrEF secondary to NICM: ***  3. Atrial flutter: ***  4. HTN: Blood pressure ***  5. Substance use: ***  Disposition: F/u with Dr. Azucena Cecil or an APP in ***, and EP as directed.   Medication Adjustments/Labs and Tests Ordered: Current medicines are reviewed at length with the patient today.  Concerns regarding medicines are outlined above. Medication changes, Labs and Tests ordered today are summarized above and listed in the Patient Instructions accessible in Encounters.   Signed, Eula Listen, PA-C 11/11/2019 7:08 AM     [facility] [place] [address] [place], [place] [address] [phone]

## 2019-11-13 ENCOUNTER — Ambulatory Visit: Payer: Self-pay | Admitting: Physician Assistant

## 2019-11-13 NOTE — Progress Notes (Signed)
Cardiology Office Note    Date:  11/20/2019   ID:  Tyler [name], DOB 10-08-1970, MRN [id number]  PCP:  Patient, No Pcp Per  Cardiologist:  Debbe Odea, MD  Electrophysiologist:  Sherryl Manges, MD   Chief Complaint: Hospital follow up  History of Present Illness:   Tyler [name] is a 49 y.o. male with history of nonobstructive CAD by LHC in 10/2019 as outlined below, HFrEF secondary to NICM, atrial flutter on Eliquis, HTN, tobacco use, and methamphetamine use who presents for hospital follow-up after diagnostic cath.  He was admitted to the hospital in 04/2019 with new onset atrial flutter with RVR with noted syncope.  Drug screen positive for amphetamine, cannabinoid, and benzodiazepine.  Initial high-sensitivity troponin 87.  Echo showed an EF of 25 to 30%, global hypokinesis, severely reduced RV systolic function with normal RV ventricular cavity size, and trivial mitral regurgitation.  He converted to sinus rhythm without cardioversion during his admission with recommendation to continue Toprol and Eliquis with plans for EP evaluation as an outpatient.  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  LifeVest was recommended.  He was readmitted in late 10/2019 with chest pain.  Initial high-sensitivity troponin 8 with a delta of 7.  He was maintaining sinus rhythm.  Repeat echo demonstrated an improved LVSF with an EF of 45 to 50%, normal LV diastolic function parameters, normal RV systolic function and ventricular cavity size, trivial mitral regurgitation.  He was not compliant with LifeVest.  Plans were arranged for patient to undergo outpatient coronary CTA on 6/24 which showed a coronary calcium score 723 which was 99th percentile for age and sex matched control.  There was no plaque along the left main, severe stenosis within the proximal LAD estimated at 70 to 99% with 25 to 49% stenosis in the distal LAD, nondominant LCx with ostial to proximal stenosis estimated at 50  to 69% that involved the ostium of D2, 25 to 49% proximal RCA stenosis with 25 to 49% mid and distal RCA stenoses.  The estimated stenoses involving th LAD and LCx or possibly overestimated due to significant blooming artifact.  FFR was normal in the proximal and mid LAD and found to be clinically significant in the distal LAD with an FFR of 0.75.  FFR of the LCx normal.  FFR of the distal RCA abnormal at 0.77.    At the recommendation of the reading physician, patient was contacted on 6/26 and advised to come into the ED given rest angina.    He subsequently presented to the ED on 6/27 with continued randomly occurring intermittently substernal chest pressure without associated symptoms.  Initial BP is elevated in the 170s.  He reported last using methamphetamine 2 days prior.  Initial high-sensitivity troponin 8 with a delta of seven.  EKG showed sinus rhythm and was nonacute.  He underwent diagnostic LHC on 11/02/2019 which showed moderate one-vessel CAD with 60% stenosis in the distal RCA which was not significant by fractional flow reserve with an IFR ratio of 0.98.  The LAD was moderately calcified in the proximal portion without obstructive disease.  He had a low normal LV SF with an EF of 50 to 55% and a mildly elevated LVEDP.  There was no critical CAD to explain his rest angina with recommendation to continue medical therapy.  He comes in feeling very well.  No chest pain, dyspnea, palpitations, dizziness, presyncope, syncope, lower extremity swelling, abdominal distention, or orthopnea.  No issues from  the right radial cardiac cath site.  He is tolerating Eliquis without issues and denies any falls, hematochezia, or melena.  He does have patient assistance paperwork at home though has not yet completed this.  He reports BP at home has typically been between the 140s to 160s systolic.  He has not smoked since early May of this year though has picked up vaping and indicates he is using this to help quit  smoking altogether.  He does not have any issues or concerns at this time.   Labs independently reviewed: 10/2019 - Hgb 14.2, PLT 253, TC 144, TG 56, HDL 68, LDL 65, potassium 3.9, BUN 8, serum creatinine 0.96, albumin 3.7, AST/ALT normal 04/2019 - TSH normal, magnesium 2.0    Past Medical History:  Diagnosis Date  . Atrial flutter (HCC)   . CAD (coronary artery disease)   . HFrEF (heart failure with reduced ejection fraction) (HCC)   . Hypertension   . Polysubstance abuse (HCC)   . Syncope     Past Surgical History:  Procedure Laterality Date  . INTRAVASCULAR PRESSURE WIRE/FFR STUDY N/A 11/02/2019   Procedure: INTRAVASCULAR PRESSURE WIRE/FFR STUDY;  Surgeon: Iran Ouch, MD;  Location: [facility] INVASIVE CV LAB;  Service: Cardiovascular;  Laterality: N/A;  . LEFT HEART CATH AND CORONARY ANGIOGRAPHY N/A 11/02/2019   Procedure: LEFT HEART CATH AND CORONARY ANGIOGRAPHY;  Surgeon: Iran Ouch, MD;  Location: [facility] INVASIVE CV LAB;  Service: Cardiovascular;  Laterality: N/A;  . NO PAST SURGERIES      Current Medications: Current Meds  Medication Sig  . apixaban (ELIQUIS) 5 MG TABS tablet Take 1 tablet (5 mg total) by mouth 2 (two) times daily.  Marland Kitchen atorvastatin (LIPITOR) 40 MG tablet Take 1 tablet (40 mg total) by mouth daily.  Marland Kitchen losartan (COZAAR) 25 MG tablet Take 1 tablet (25 mg total) by mouth 2 (two) times daily.  . metoprolol succinate (TOPROL-XL) 50 MG 24 hr tablet Take 1 tablet (50 mg total) by mouth 2 times daily at 12 noon and 4 pm. Take with or immediately following a meal.  . Multiple Vitamin (MULTIVITAMIN WITH MINERALS) TABS tablet Take 1 tablet by mouth daily.    Allergies:   Patient has no known allergies.   Social History   Socioeconomic History  . Marital status: Married    Spouse name: Not on file  . Number of children: Not on file  . Years of education: Not on file  . Highest education level: Not on file  Occupational History  . Not on file  Tobacco  Use  . Smoking status: Former Smoker    Packs/day: 1.00    Years: 37.00    Pack years: 37.00    Quit date: 09/20/2019    Years since quitting: 0.1  . Smokeless tobacco: Never Used  Vaping Use  . Vaping Use: Some days  Substance and Sexual Activity  . Alcohol use: Yes    Comment: beer daily  . Drug use: Yes    Types: Methamphetamines, Marijuana  . Sexual activity: Not on file  Other Topics Concern  . Not on file  Social History Narrative   One daughter.     Social Determinants of Health   Financial Resource Strain:   . Difficulty of Paying Living Expenses:   Food Insecurity:   . Worried About Programme researcher, broadcasting/film/video in the Last Year:   . Barista in the Last Year:   Transportation Needs:   . Lack  of Transportation (Medical):   Marland Kitchen Lack of Transportation (Non-Medical):   Physical Activity:   . Days of Exercise per Week:   . Minutes of Exercise per Session:   Stress:   . Feeling of Stress :   Social Connections:   . Frequency of Communication with Friends and Family:   . Frequency of Social Gatherings with Friends and Family:   . Attends Religious Services:   . Active Member of Clubs or Organizations:   . Attends Banker Meetings:   Marland Kitchen Marital Status:      Family History:  The patient's family history includes Heart attack (age of onset: 1) in his mother; Pancreatic cancer in his father.  ROS:   Review of Systems  Constitutional: Negative for chills, diaphoresis, fever, malaise/fatigue and weight loss.  HENT: Negative for congestion.   Eyes: Negative for discharge and redness.  Respiratory: Negative for cough, sputum production, shortness of breath and wheezing.   Cardiovascular: Negative for chest pain, palpitations, orthopnea, claudication, leg swelling and PND.  Gastrointestinal: Negative for abdominal pain, blood in stool, heartburn, melena, nausea and vomiting.  Musculoskeletal: Negative for falls and myalgias.  Skin: Negative for rash.    Neurological: Negative for dizziness, tingling, tremors, sensory change, speech change, focal weakness, loss of consciousness and weakness.  Endo/Heme/Allergies: Does not bruise/bleed easily.  Psychiatric/Behavioral: Negative for substance abuse. The patient is not nervous/anxious.   All other systems reviewed and are negative.    EKGs/Labs/Other Studies Reviewed:    Studies reviewed were summarized above. The additional studies were reviewed today:  LHC 11/02/2019:  The left ventricular ejection fraction is 50-55% by visual estimate.  LV end diastolic pressure is mildly elevated.  The left ventricular systolic function is normal.  Prox RCA lesion is 20% stenosed.  Dist RCA lesion is 60% stenosed.  1st Mrg lesion is 20% stenosed.  Prox Cx to Mid Cx lesion is 30% stenosed.  Prox LAD to Mid LAD lesion is 30% stenosed.   1.  Moderate one-vessel coronary artery disease with 60% stenosis in the distal right coronary artery.  This was not significant by fractional flow reserve evaluation with an iFR ratio of 0.98.  Moderately calcified proximal LAD with no obstructive disease. 2.  Low normal LV systolic function with an EF of 50 to 55%.  Mildly elevated left ventricular end-diastolic pressure.  Recommendations: No critical CAD to explain the patient's rest pain.  Recommend medical therapy.  I added atorvastatin. Eliquis can be resumed tomorrow if no bleeding issues. The patient can be discharged home from a cardiac standpoint. __________  Coronary CTA 10/30/2019: IMPRESSION: 1. Coronary calcium score of 723. This was 99th percentile for age and sex matched control.  2.  Normal coronary origin with right dominance.  3. Severe atherosclerosis of the LAD. CAD-RADS=4a. This may be overestimated due to significant blooming artifact. Breathing and cardiac motion artifact limits quality of study.  4. Consider symptom-guided anti-ischemic and preventive pharmacotherapy as well  as risk factor modification per guideline-directed care.  5. Other treatments (including options of revascularization) should be considered per guideline-directed care.  6.  This study has been submitted for FFR flow analysis.  IMPRESSION: 1. CT FFR flow analysis demonstrates possible flow limiting lesions in the mid to distal LAD and RCA. The vessel diameter distal to the RCA lesion is 3mm and 2.54mm distal to LAD lesion.  2.    Recommend cardiac catheterization for further evaluation. __________  2D echo 10/26/2019: 1. Left ventricular ejection fraction,  by estimation, is 45 to 50%. The  left ventricle has mildly decreased function. The left ventricle has no  regional wall motion abnormalities. Left ventricular diastolic parameters  were normal.  2. Right ventricular systolic function is normal. The right ventricular  size is normal.  3. The mitral valve is normal in structure. Trivial mitral valve  regurgitation. No evidence of mitral stenosis.  4. The aortic valve is normal in structure. Aortic valve regurgitation is  not visualized. No aortic stenosis is present.  5. The inferior vena cava is normal in size with greater than 50%  respiratory variability, suggesting right atrial pressure of 3 mmHg. __________  2D echo 05/08/2019: 1. Left ventricular ejection fraction, by visual estimation, is 25 to  30%. The left ventricle has severely decreased function. There is no left  ventricular hypertrophy.  2. Definity contrast agent was given IV to delineate the left ventricular  endocardial borders.  3. Indeterminate diastolic filling due to E-A fusion.  4. The left ventricle demonstrates global hypokinesis.  5. Global right ventricle has severely reduced systolic function.The  right ventricular size is normal. Right vetricular wall thickness was not  assessed.  6. Left atrial size was normal.  7. Right atrial size was normal.  8. The mitral valve is normal in  structure. Trivial mitral valve  regurgitation.  9. The tricuspid valve is normal in structure.  10. The aortic valve is normal in structure. Aortic valve regurgitation is  not visualized.  11. The pulmonic valve was normal in structure. Pulmonic valve  regurgitation is not visualized.  12. Normal pulmonary artery systolic pressure.  13. The inferior vena cava is normal in size with <50% respiratory  variability, suggesting right atrial pressure of 8 mmHg.    EKG:  EKG is ordered today.  The EKG ordered today demonstrates NSR, 81 bpm, LVH, no acute ST-T changes  Recent Labs: 05/07/2019: Magnesium 2.0; TSH 3.250 11/02/2019: ALT 24; BUN 8; Creatinine, Ser 0.96; Hemoglobin 14.2; Platelets 253; Potassium 3.9; Sodium 141  Recent Lipid Panel    Component Value Date/Time   CHOL 144 11/02/2019 0524   TRIG 56 11/02/2019 0524   HDL 68 11/02/2019 0524   CHOLHDL 2.1 11/02/2019 0524   VLDL 11 11/02/2019 0524   LDLCALC 65 11/02/2019 0524    PHYSICAL EXAM:    VS:  BP (!) 160/100 (BP Location: Left Arm, Patient Position: Sitting, Cuff Size: Normal)   Pulse 81   Ht 5\' 8"  (1.727 m)   Wt 168 lb 8 oz (76.4 kg)   SpO2 98%   BMI 25.62 kg/m   BMI: Body mass index is 25.62 kg/m.  Physical Exam Constitutional:      Appearance: He is well-developed.  HENT:     Head: Normocephalic and atraumatic.  Eyes:     General:        Right eye: No discharge.        Left eye: No discharge.  Neck:     Vascular: No JVD.  Cardiovascular:     Rate and Rhythm: Normal rate and regular rhythm.     Pulses: No midsystolic click and no opening snap.          Posterior tibial pulses are 2+ on the right side and 2+ on the left side.     Heart sounds: Normal heart sounds, S1 normal and S2 normal. Heart sounds not distant. No murmur heard.  No friction rub.     Comments: Right radial cardiac cath site is well-healing  without active bleeding, bruising, swelling, warmth, erythema, or tenderness to palpation.   Radial pulse 2+. Pulmonary:     Effort: Pulmonary effort is normal. No respiratory distress.     Breath sounds: Normal breath sounds. No decreased breath sounds, wheezing or rales.  Chest:     Chest wall: No tenderness.  Abdominal:     General: There is no distension.     Palpations: Abdomen is soft.     Tenderness: There is no abdominal tenderness.  Musculoskeletal:     Cervical back: Normal range of motion.  Skin:    General: Skin is warm and dry.     Nails: There is no clubbing.  Neurological:     Mental Status: He is alert and oriented to person, place, and time.  Psychiatric:        Speech: Speech normal.        Behavior: Behavior normal.        Thought Content: Thought content normal.        Judgment: Judgment normal.     Wt Readings from Last 3 Encounters:  11/20/19 168 lb 8 oz (76.4 kg)  11/01/19 169 lb (76.7 kg)  10/24/19 160 lb (72.6 kg)     ASSESSMENT & PLAN:   1. Nonobstructive CAD: He denies any symptoms concerning for angina.  Recent diagnostic LHC showed nonobstructive disease as outlined above.  He remains on Eliquis in place of aspirin.  Following potential atrial flutter ablation down the road after he has completed post procedure anticoagulation and if his OAC is able to be discontinued would resume aspirin thereafter.  Continue atorvastatin, losartan, and metoprolol.  Risk factor modification.  Post-cath instructions.  2. HFrEF secondary to NICM: He appears euvolemic and well compensated.  His cardiomyopathy is felt to be tachycardia mediated in the setting of atrial flutter with RVR with most recent echo demonstrating improvement in LV SF while in sinus rhythm as outlined above.  Titrate Toprol-XL to 75 mg twice daily given elevated BP.  He will otherwise continue current dose of losartan.  CHF education.  3. Atrial flutter: Maintaining sinus rhythm.  Titrate Toprol-XL given elevated BP.  CHA2DS2-VASc 2 3.  Follow-up with EP for consideration of atrial  flutter ablation in an effort to discontinue Eliquis moving forward post procedure given cost and compliance concerns.  Recommend patient complete patient assistance form.  No symptoms concerning for bleeding.  Recent normal hemoglobin.  4. HTN: Blood pressure is mildly elevated in the office today.  Increase Toprol-XL to 75 mg twice daily.  He will otherwise continue losartan 25 mg twice daily.  Low-sodium diet recommended.  5. Polysubstance use: He has not smoked since early May of this year though has picked up vaping in an effort to quit.  Complete cessation of amphetamine and vaping is recommended.  Disposition: F/u with Dr. Azucena Cecil or an APP in 3 months, and EP as directed.    Medication Adjustments/Labs and Tests Ordered: Current medicines are reviewed at length with the patient today.  Concerns regarding medicines are outlined above. Medication changes, Labs and Tests ordered today are summarized above and listed in the Patient Instructions accessible in Encounters.   Signed, Eula Listen, PA-C 11/20/2019 10:34 AM     [facility] [address] [place], [place] [address] [phone]

## 2019-11-20 ENCOUNTER — Ambulatory Visit (INDEPENDENT_AMBULATORY_CARE_PROVIDER_SITE_OTHER): Payer: Self-pay | Admitting: Physician Assistant

## 2019-11-20 ENCOUNTER — Encounter: Payer: Self-pay | Admitting: Physician Assistant

## 2019-11-20 ENCOUNTER — Other Ambulatory Visit: Payer: Self-pay

## 2019-11-20 VITALS — BP 160/100 | HR 81 | Ht 68.0 in | Wt 168.5 lb

## 2019-11-20 DIAGNOSIS — I4892 Unspecified atrial flutter: Secondary | ICD-10-CM

## 2019-11-20 DIAGNOSIS — I428 Other cardiomyopathies: Secondary | ICD-10-CM

## 2019-11-20 DIAGNOSIS — I5020 Unspecified systolic (congestive) heart failure: Secondary | ICD-10-CM

## 2019-11-20 DIAGNOSIS — I10 Essential (primary) hypertension: Secondary | ICD-10-CM

## 2019-11-20 DIAGNOSIS — F1910 Other psychoactive substance abuse, uncomplicated: Secondary | ICD-10-CM

## 2019-11-20 DIAGNOSIS — I2510 Atherosclerotic heart disease of native coronary artery without angina pectoris: Secondary | ICD-10-CM

## 2019-11-20 MED ORDER — METOPROLOL SUCCINATE ER 50 MG PO TB24: 75.0000 mg | ORAL_TABLET | Freq: Two times a day (BID) | ORAL | 2 refills | Status: AC

## 2019-11-20 NOTE — Patient Instructions (Signed)
Medication Instructions:  Your physician has recommended you make the following change in your medication:   INCREASE Metoprolol to 75 mg (1 and 1/2 tablet) twice daily. An Rx has been sent to your pharmacy.   Lab Work: None ordered If you have labs (blood work) drawn today and your tests are completely normal, you will receive your results only by: Marland Kitchen MyChart Message (if you have MyChart) OR . A paper copy in the mail If you have any lab test that is abnormal or we need to change your treatment, we will call you to review the results.   Testing/Procedures: None ordered   Follow-Up: At [facility], you and your health needs are our priority.  As part of our continuing mission to provide you with exceptional heart care, we have created designated Provider Care Teams.  These Care Teams include your primary Cardiologist (physician) and Advanced Practice Providers (APPs -  Physician Assistants and Nurse Practitioners) who all work together to provide you with the care you need, when you need it.  We recommend signing up for the patient portal called "MyChart".  Sign up information is provided on this After Visit Summary.  MyChart is used to connect with patients for Virtual Visits (Telemedicine).  Patients are able to view lab/test results, encounter notes, upcoming appointments, etc.  Non-urgent messages can be sent to your provider as well.   To learn more about what you can do with MyChart, go to [email or web address].    Your next appointment:   3 month(s)  Your physician recommends that you schedule a follow-up appointment with Dr. Graciela Husbands to discuss Aflutter ablation consideration.   The format for your next appointment:   In Person  Provider:    You may see Debbe Odea, MD or one of the following Advanced Practice Providers on your designated Care Team:    Nicolasa Ducking, NP  Eula Listen, PA-C  Marisue Ivan, PA-C    Other Instructions N/A

## 2020-02-22 ENCOUNTER — Ambulatory Visit: Payer: Self-pay | Admitting: Cardiology

## 2020-02-23 ENCOUNTER — Encounter: Payer: Self-pay | Admitting: Cardiology

## 2020-03-29 ENCOUNTER — Encounter: Payer: Self-pay | Admitting: Emergency Medicine

## 2020-03-29 ENCOUNTER — Emergency Department: Payer: Medicaid Other

## 2020-03-29 ENCOUNTER — Emergency Department
Admission: EM | Admit: 2020-03-29 | Discharge: 2020-03-29 | Disposition: A | Discharge status: Home / Self Care | Payer: Medicaid Other | Attending: Emergency Medicine | Admitting: Emergency Medicine

## 2020-03-29 ENCOUNTER — Other Ambulatory Visit: Payer: Self-pay

## 2020-03-29 DIAGNOSIS — I110 Hypertensive heart disease with heart failure: Secondary | ICD-10-CM | POA: Insufficient documentation

## 2020-03-29 DIAGNOSIS — Z008 Encounter for other general examination: Secondary | ICD-10-CM

## 2020-03-29 DIAGNOSIS — Z87891 Personal history of nicotine dependence: Secondary | ICD-10-CM | POA: Insufficient documentation

## 2020-03-29 DIAGNOSIS — I25110 Atherosclerotic heart disease of native coronary artery with unstable angina pectoris: Secondary | ICD-10-CM | POA: Insufficient documentation

## 2020-03-29 DIAGNOSIS — W19XXXA Unspecified fall, initial encounter: Secondary | ICD-10-CM | POA: Insufficient documentation

## 2020-03-29 DIAGNOSIS — I509 Heart failure, unspecified: Secondary | ICD-10-CM | POA: Insufficient documentation

## 2020-03-29 DIAGNOSIS — Z79899 Other long term (current) drug therapy: Secondary | ICD-10-CM | POA: Insufficient documentation

## 2020-03-29 DIAGNOSIS — M25511 Pain in right shoulder: Secondary | ICD-10-CM | POA: Insufficient documentation

## 2020-03-29 DIAGNOSIS — Z7901 Long term (current) use of anticoagulants: Secondary | ICD-10-CM | POA: Insufficient documentation

## 2020-03-29 MED ORDER — LIDOCAINE 5 % EX PTCH
1.0000 | MEDICATED_PATCH | CUTANEOUS | Status: DC
  Administered 2020-03-29: 1 via TRANSDERMAL
  Filled 2020-03-29: qty 1

## 2020-03-29 NOTE — ED Triage Notes (Signed)
Pt in via [facility] under custody.Pt reports fell this am and thinks he dislocated his right shoulder

## 2020-03-29 NOTE — ED Notes (Signed)
Patient verbalizes understanding of discharge instructions. Opportunity for questioning and answers were provided. Armband removed by staff, pt discharged from ED. Ambulated out with sheriff

## 2020-03-29 NOTE — ED Provider Notes (Signed)
[facility] Emergency Department Provider Note   ____________________________________________   First MD Initiated Contact with Patient 03/29/20 1053     (approximate)  I have reviewed the triage vital signs and the nursing notes.   HISTORY  Chief Complaint Shoulder Pain and Fall    HPI Tyler [name] is a 49 y.o. male patient complain of right shoulder pain secondary to fall.  Patient is in custody of police state he fell on his arm he thinks he might have dislocated his right shoulder.  Patient rates his pain as 8/10.  Patient described pain is "achy".  No palliative measure prior to arrival.  Patient is in police custody.         Past Medical History:  Diagnosis Date  . Atrial flutter (HCC)   . CAD (coronary artery disease)   . HFrEF (heart failure with reduced ejection fraction) (HCC)   . Hypertension   . Polysubstance abuse (HCC)   . Syncope     Patient Active Problem List   Diagnosis Date Noted  .  Possible Acetaminophen toxicity 11/02/2019  . Unstable angina (HCC) 11/01/2019  . Chest pain 10/24/2019  . HFrEF (heart failure with reduced ejection fraction) (HCC)   . Substance abuse (HCC)   . Cardiomyopathy (HCC)   . Syncope and collapse 05/07/2019  . Atrial flutter (HCC)   . Essential hypertension     Past Surgical History:  Procedure Laterality Date  . INTRAVASCULAR PRESSURE WIRE/FFR STUDY N/A 11/02/2019   Procedure: INTRAVASCULAR PRESSURE WIRE/FFR STUDY;  Surgeon: Iran Ouch, MD;  Location: [facility] INVASIVE CV LAB;  Service: Cardiovascular;  Laterality: N/A;  . LEFT HEART CATH AND CORONARY ANGIOGRAPHY N/A 11/02/2019   Procedure: LEFT HEART CATH AND CORONARY ANGIOGRAPHY;  Surgeon: Iran Ouch, MD;  Location: [facility] INVASIVE CV LAB;  Service: Cardiovascular;  Laterality: N/A;  . NO PAST SURGERIES      Prior to Admission medications   Medication Sig Start Date End Date Taking? Authorizing Provider  apixaban (ELIQUIS) 5 MG  TABS tablet Take 1 tablet (5 mg total) by mouth 2 (two) times daily. 05/11/19   Enedina Finner, MD  atorvastatin (LIPITOR) 40 MG tablet Take 1 tablet (40 mg total) by mouth daily. 11/02/19 12/02/19  Dorcas Carrow, MD  losartan (COZAAR) 25 MG tablet Take 1 tablet (25 mg total) by mouth 2 (two) times daily. 05/11/19   Enedina Finner, MD  metoprolol succinate (TOPROL-XL) 50 MG 24 hr tablet Take 1.5 tablets (75 mg total) by mouth 2 times daily at 12 noon and 4 pm. 11/20/19   Sondra Barges, PA-C  Multiple Vitamin (MULTIVITAMIN WITH MINERALS) TABS tablet Take 1 tablet by mouth daily. 05/12/19   Enedina Finner, MD    Allergies Patient has no known allergies.  Family History  Problem Relation Age of Onset  . Heart attack Mother 5  . Pancreatic cancer Father     Social History Social History   Tobacco Use  . Smoking status: Former Smoker    Packs/day: 1.00    Years: 37.00    Pack years: 37.00    Quit date: 09/20/2019    Years since quitting: 0.5  . Smokeless tobacco: Never Used  Vaping Use  . Vaping Use: Some days  Substance Use Topics  . Alcohol use: Yes    Comment: beer daily  . Drug use: Yes    Types: Methamphetamines, Marijuana    Review of Systems  Constitutional: No fever/chills Eyes: No visual changes. ENT:  No sore throat. Cardiovascular: Denies chest pain. Respiratory: Denies shortness of breath. Gastrointestinal: No abdominal pain.  No nausea, no vomiting.  No diarrhea.  No constipation. Genitourinary: Negative for dysuria. Musculoskeletal: Right shoulder pain. Skin: Negative for rash. Neurological: Negative for headaches, focal weakness or numbness. Psychiatric:  Polysubstance abuse. Endocrine:  Hypertension.   ____________________________________________   PHYSICAL EXAM:  VITAL SIGNS: ED Triage Vitals [03/29/20 1028]  Enc Vitals Group     BP (!) 181/107     Pulse Rate (!) 110     Resp 20     Temp 98.4 F (36.9 C)     Temp Source Oral     SpO2 97 %     Weight 170 lb  (77.1 kg)     Height 5\' 8"  (1.727 m)     Head Circumference      Peak Flow      Pain Score 8     Pain Loc      Pain Edu?      Excl. in GC?    Constitutional: Alert and oriented. Well appearing and in no acute distress. Eyes: Conjunctivae are normal. PERRL. EOMI. Head: Atraumatic. Neck: No cervical spine tenderness to palpation. Hematological/Lymphatic/Immunilogical: No cervical lymphadenopathy. Cardiovascular: Normal rate, regular rhythm. Grossly normal heart sounds.  Good peripheral circulation.  Elevated blood pressure. Respiratory: Normal respiratory effort.  No retractions. Lungs CTAB. Gastrointestinal: Soft and nontender. No distention. No abdominal bruits. No CVA tenderness. Genitourinary: Deferred Musculoskeletal: No obvious deformity of the right shoulder.  Patient has moderate guarding palpation humeral area. Neurologic:  Normal speech and language. No gross focal neurologic deficits are appreciated. No gait instability. Skin:  Skin is warm, dry and intact. No rash noted.  No abrasion or ecchymosis. Psychiatric: Mood and affect are normal. Speech and behavior are normal.  ____________________________________________   LABS (all labs ordered are listed, but only abnormal results are displayed)  Labs Reviewed - No data to display ____________________________________________  EKG   ____________________________________________  RADIOLOGY I, , personally viewed and evaluated these images (plain radiographs) as part of my medical decision making, as well as reviewing the written report by the radiologist.  ED MD interpretation: No acute findings on x-ray of the right shoulder.  Official radiology report(s): DG Shoulder Right  Result Date: 03/29/2020 CLINICAL DATA:  Possible dislocation. EXAM: RIGHT SHOULDER - 2+ VIEW COMPARISON:  None. FINDINGS: There is no evidence of fracture or dislocation. Moderate acromioclavicular degenerative change. IMPRESSION: No  evidence of acute fracture or dislocation. Electronically Signed   By: 03/31/2020 MD   On: 03/29/2020 11:01    ____________________________________________   PROCEDURES  Procedure(s) performed (including Critical Care):  Procedures   ____________________________________________   INITIAL IMPRESSION / ASSESSMENT AND PLAN / ED COURSE  As part of my medical decision making, I reviewed the following data within the electronic MEDICAL RECORD NUMBER         Patient presents with right shoulder pain secondary to fall.  Discussed no acute findings on x-ray.  Patient has degenerative changes.  Patient given discharge care instruction.  Patient had a Lidoderm patch applied to the shoulder prior to departure.  Patient is cleared for incarceration.     ____________________________________________   FINAL CLINICAL IMPRESSION(S) / ED DIAGNOSES  Final diagnoses:  Acute pain of right shoulder  Medical clearance for incarceration     ED Discharge Orders    None      *Please note:  Tyler [name] was evaluated in Emergency  Department on 03/29/2020 for the symptoms described in the history of present illness. He was evaluated in the context of the global COVID-19 pandemic, which necessitated consideration that the patient might be at risk for infection with the SARS-CoV-2 virus that causes COVID-19. Institutional protocols and algorithms that pertain to the evaluation of patients at risk for COVID-19 are in a state of rapid change based on information released by regulatory bodies including the CDC and federal and state organizations. These policies and algorithms were followed during the patient's care in the ED.  Some ED evaluations and interventions may be delayed as a result of limited staffing during and the pandemic.*   Note:  This document was prepared using Dragon voice recognition software and may include unintentional dictation errors.    Joni Reining, PA-C 03/29/20  1204    Shaune Pollack, MD 03/29/20 2111

## 2020-03-29 NOTE — Discharge Instructions (Signed)
Your x-ray was unremarkable for subluxation/dislocation or fracture.  You have degenerative changes consistent with arthritis.  Advised over-the-counter extra strength Tylenol ibuprofen as needed for pain.

## 2021-06-20 IMAGING — CT CT HEART MORP W/ CTA COR W/ SCORE W/ CA W/CM &/OR W/O CM
1 of 16 series · 2 of 20 positions shown, 3 images · non-contrast
Comparison: 10/24/2019 CTA chest
COMPARISON: 10/24/2019 CTA chest

Addendum:
EXAM:
OVER-READ INTERPRETATION  CT CHEST

The following report is an over-read performed by radiologist Dr.
Ntsumi Elly [REDACTED] on 10/30/2019. This over-read
does not include interpretation of cardiac or coronary anatomy or
pathology. The coronary CTA interpretation by the cardiologist is
attached.
TECHNIQUE: The patient was scanned on a [employer] Force scanner.

[Series 44: ms multiphase cta coronary 0.60 · axial · 0.36mm/px · z∈[-1070,-1028]mm · 2 of 2889 slices shown, 3 images]
[im 963/2889  vessel]
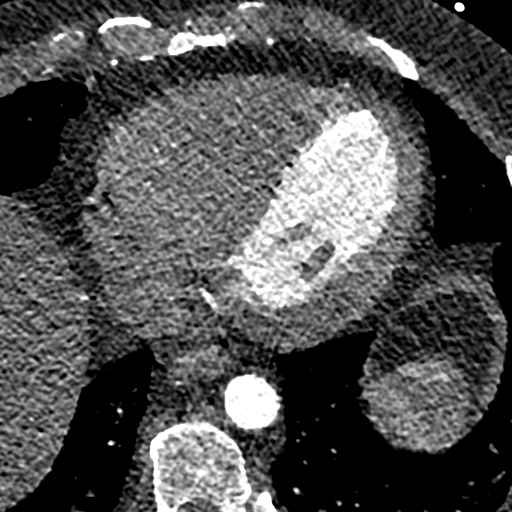
[im 963/2889  lung]
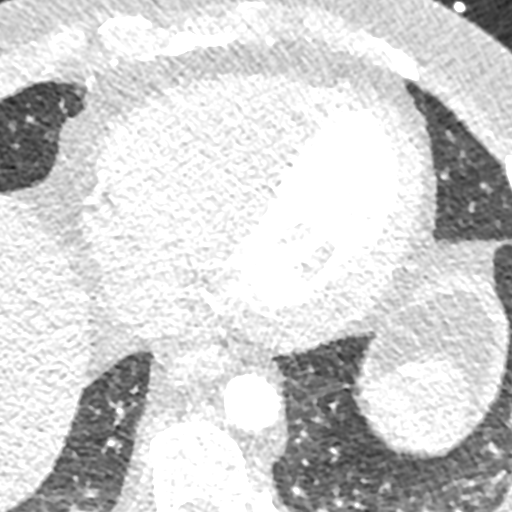
[im 1926/2889  vessel]
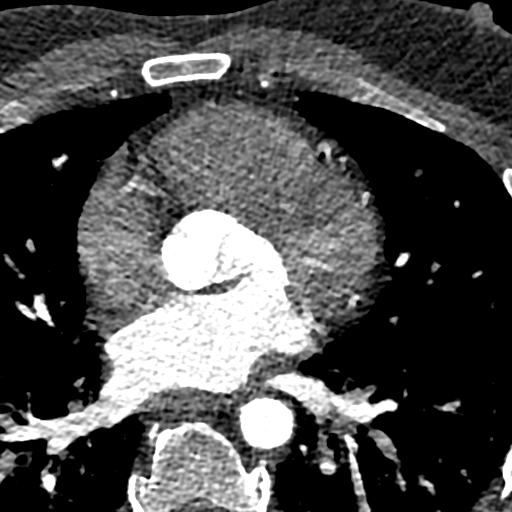

[2 of 20 positions shown; findings below may reference images not displayed]

FINDINGS: Vascular: Aortic atherosclerosis. No central pulmonary embolism, on
this non-dedicated study.

Mediastinum/Nodes: No imaged thoracic adenopathy.

Lungs/Pleura: No pleural fluid. Right middle lobe calcified
granuloma.

Upper Abdomen: Normal imaged portions of the liver, spleen, stomach.

Musculoskeletal: Remote lateral right lower rib trauma.
IMPRESSION: 1. No acute findings in the imaged extracardiac chest.
2. Aortic Atherosclerosis (4RP37-6ML.L).

EXAM:
Cardiac/Coronary  CT
FINDINGS: A 120 kV prospective scan was triggered in the descending thoracic
aorta at 111 HU's. Axial non-contrast 3 mm slices were carried out
through the heart. The data set was analyzed on a dedicated work
station and scored using the Agatson method. Gantry rotation speed
was 250 msecs and collimation was .6 mm. No beta blockade and 0.8 mg
of sl NTG was given. The 3D data set was reconstructed in 5%
intervals of the 67-82 % of the R-R cycle. Diastolic phases were
analyzed on a dedicated work station using MPR, MIP and VRT modes.
The patient received 80 cc of contrast.

Aorta:  Normal size.  No calcifications.  No dissection.

Aortic Valve:  Trileaflet.  No calcifications.

Coronary Arteries:  Normal coronary origin.  Right dominance.

RCA is a large dominant artery that gives rise to PDA and PLVB.
There is mild mixed plaque with positive remodeling in the proximal
RCA with associated stenosis of 25-49%. There is mild mixed plaque
in the mid and distal RCA with associated stenosis of 25-49%

Left main is a large artery that gives rise to LAD and LCX arteries.
There is no plaque.

LAD is a large vessel that gives rise to 3 moderate sized diagonals.
There is severe comples mixed plaque in the proximal LAD with
associated stenosis of 70-99%. There is mild calcified plaque in the
distal LAD with associated stenosis of 25-49%.

LCX is a non-dominant artery that gives rise to 2 moderate sized OM1
and OM2 branches. There is moderate mixed calcified plaque in the
ostial/proximal LCx with associated stenosis of 50-69% that involves
the ostium of the D2. This may be overestimated by blooming
artifact.

Other findings:

Normal pulmonary vein drainage into the left atrium.

Normal let atrial appendage without a thrombus.

Normal size of the pulmonary artery.
IMPRESSION: 1. Coronary calcium score of 723. This was 99th percentile for age
and sex matched control.

2.  Normal coronary origin with right dominance.

3. Severe atherosclerosis of the LAD. CAD-RADS=4a. This may be
overestimated due to significant blooming artifact. Breathing and
cardiac motion artifact limits quality of study.

4. Consider symptom-guided anti-ischemic and preventive
pharmacotherapy as well as risk factor modification per
guideline-directed care.

5. Other treatments (including options of revascularization) should
be considered per guideline-directed care.

6.  This study has been submitted for FFR flow analysis.

Taya Mcewan

*** End of Addendum ***
EXAM:
OVER-READ INTERPRETATION  CT CHEST

The following report is an over-read performed by radiologist Dr.
Ntsumi Elly [REDACTED] on 10/30/2019. This over-read
does not include interpretation of cardiac or coronary anatomy or
pathology. The coronary CTA interpretation by the cardiologist is
attached.
FINDINGS: Vascular: Aortic atherosclerosis. No central pulmonary embolism, on
this non-dedicated study.

Mediastinum/Nodes: No imaged thoracic adenopathy.

Lungs/Pleura: No pleural fluid. Right middle lobe calcified
granuloma.

Upper Abdomen: Normal imaged portions of the liver, spleen, stomach.

Musculoskeletal: Remote lateral right lower rib trauma.
IMPRESSION: 1. No acute findings in the imaged extracardiac chest.
2. Aortic Atherosclerosis (4RP37-6ML.L).

## 2021-06-23 IMAGING — CR DG CHEST 2V
1 series · 2 of 2 positions shown · non-contrast
Comparison: 10/24/2019

CLINICAL DATA: Chest pain

EXAM:
CHEST - 2 VIEW

[Series 1: dg chest 2 view · 0.14mm/px · 2 of 2 slices shown]
[im 1/2]
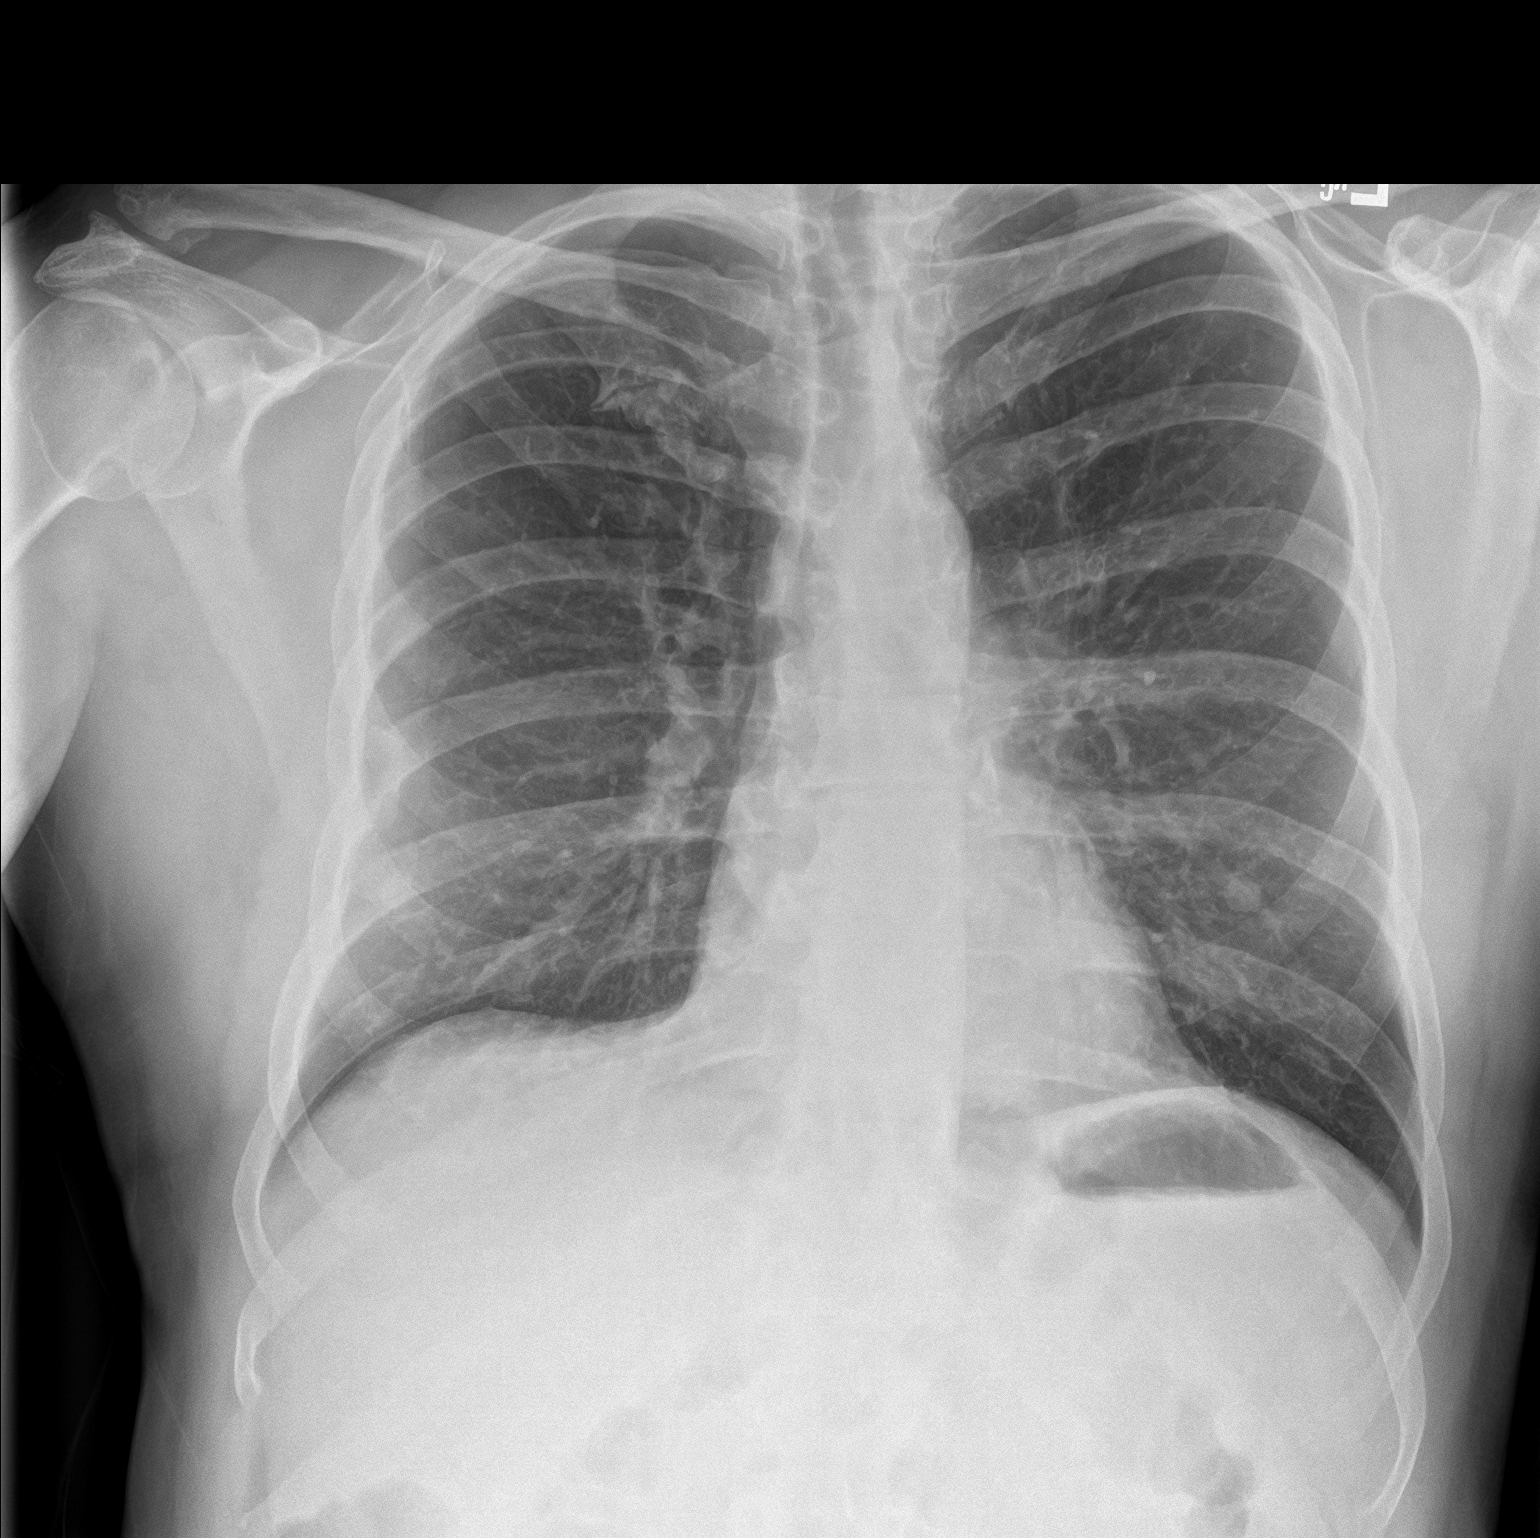
[im 2/2]
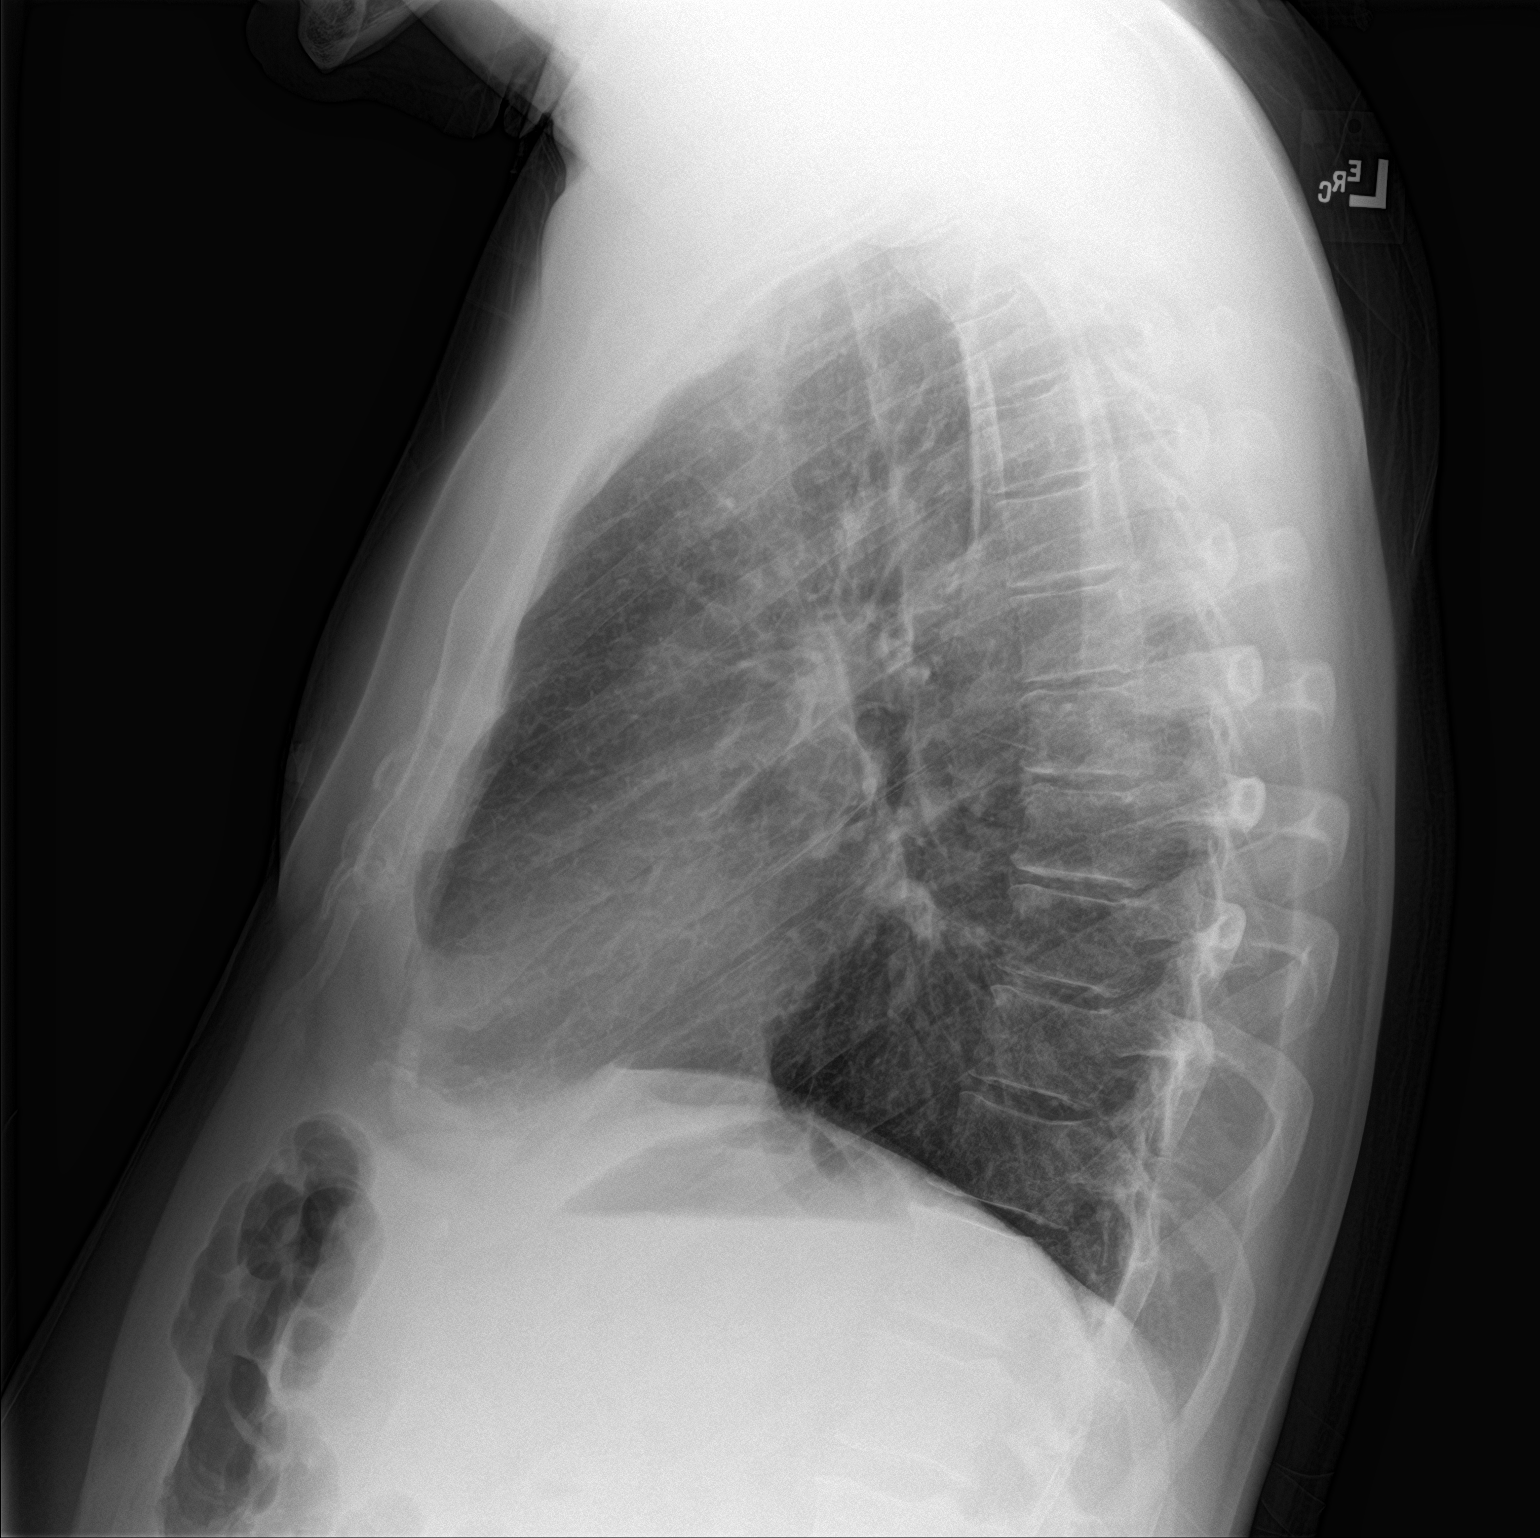

[2 of 2 positions shown; findings below may reference images not displayed]

FINDINGS: Nodular densities project over both lower lungs, felt represent
nipple shadows. Heart is normal size. No confluent opacities or
effusions. No acute bony abnormality. Old healed right lateral 7th
rib fracture.
IMPRESSION: No acute cardiopulmonary disease.
